# Patient Record
Sex: Male | Born: 1940 | Race: White | Hispanic: No | Smoking: Former smoker
Health system: Southern US, Community
[De-identification: ages and names within clinical notes are randomized; demographics above are authoritative.]

## PROBLEM LIST (undated history)

## (undated) DIAGNOSIS — M545 Low back pain, unspecified: Secondary | ICD-10-CM

## (undated) DIAGNOSIS — Z9119 Patient's noncompliance with other medical treatment and regimen: Secondary | ICD-10-CM

## (undated) DIAGNOSIS — E119 Type 2 diabetes mellitus without complications: Secondary | ICD-10-CM

## (undated) DIAGNOSIS — I4819 Other persistent atrial fibrillation: Secondary | ICD-10-CM

## (undated) DIAGNOSIS — E039 Hypothyroidism, unspecified: Secondary | ICD-10-CM

## (undated) DIAGNOSIS — K219 Gastro-esophageal reflux disease without esophagitis: Secondary | ICD-10-CM

## (undated) DIAGNOSIS — Z91199 Patient's noncompliance with other medical treatment and regimen due to unspecified reason: Secondary | ICD-10-CM

## (undated) DIAGNOSIS — I2510 Atherosclerotic heart disease of native coronary artery without angina pectoris: Secondary | ICD-10-CM

## (undated) DIAGNOSIS — G8929 Other chronic pain: Secondary | ICD-10-CM

## (undated) DIAGNOSIS — E049 Nontoxic goiter, unspecified: Secondary | ICD-10-CM

## (undated) DIAGNOSIS — I119 Hypertensive heart disease without heart failure: Secondary | ICD-10-CM

## (undated) DIAGNOSIS — I639 Cerebral infarction, unspecified: Secondary | ICD-10-CM

## (undated) DIAGNOSIS — M069 Rheumatoid arthritis, unspecified: Secondary | ICD-10-CM

## (undated) DIAGNOSIS — I495 Sick sinus syndrome: Secondary | ICD-10-CM

## (undated) DIAGNOSIS — E7800 Pure hypercholesterolemia, unspecified: Secondary | ICD-10-CM

## (undated) HISTORY — PX: LUMBAR SPINE HARDWARE REMOVAL: SHX1987

## (undated) HISTORY — DX: Rheumatoid arthritis, unspecified: M06.9

## (undated) HISTORY — DX: Sick sinus syndrome: I49.5

## (undated) HISTORY — PX: CORONARY ANGIOPLASTY WITH STENT PLACEMENT: SHX49

## (undated) HISTORY — DX: Other persistent atrial fibrillation: I48.19

## (undated) HISTORY — DX: Atherosclerotic heart disease of native coronary artery without angina pectoris: I25.10

## (undated) HISTORY — DX: Patient's noncompliance with other medical treatment and regimen due to unspecified reason: Z91.199

## (undated) HISTORY — PX: PACEMAKER INSERTION: SHX728

## (undated) HISTORY — DX: Hypertensive heart disease without heart failure: I11.9

## (undated) HISTORY — DX: Patient's noncompliance with other medical treatment and regimen: Z91.19

## (undated) HISTORY — PX: ROTATOR CUFF REPAIR: SHX139

## (undated) HISTORY — PX: POSTERIOR LUMBAR FUSION: SHX6036

## (undated) HISTORY — PX: BACK SURGERY: SHX140

---

## 1998-05-25 ENCOUNTER — Encounter: Admission: RE | Admit: 1998-05-25 | Discharge: 1998-08-23 | Payer: Self-pay

## 1998-07-30 ENCOUNTER — Ambulatory Visit (HOSPITAL_BASED_OUTPATIENT_CLINIC_OR_DEPARTMENT_OTHER): Admission: RE | Admit: 1998-07-30 | Discharge: 1998-07-30 | Payer: Self-pay | Admitting: Orthopaedic Surgery

## 1998-08-14 ENCOUNTER — Encounter: Admission: RE | Admit: 1998-08-14 | Discharge: 1998-11-12 | Payer: Self-pay | Admitting: Orthopaedic Surgery

## 1998-11-13 ENCOUNTER — Encounter: Admission: RE | Admit: 1998-11-13 | Discharge: 1998-12-10 | Payer: Self-pay | Admitting: Orthopaedic Surgery

## 2001-11-02 ENCOUNTER — Inpatient Hospital Stay (HOSPITAL_COMMUNITY): Admission: EM | Admit: 2001-11-02 | Discharge: 2001-11-08 | Payer: Self-pay | Admitting: Cardiology

## 2001-11-03 ENCOUNTER — Encounter: Payer: Self-pay | Admitting: *Deleted

## 2001-11-03 ENCOUNTER — Encounter: Payer: Self-pay | Admitting: Cardiology

## 2001-11-05 ENCOUNTER — Encounter: Payer: Self-pay | Admitting: Cardiovascular Disease

## 2001-11-08 ENCOUNTER — Encounter: Payer: Self-pay | Admitting: Internal Medicine

## 2002-06-04 ENCOUNTER — Ambulatory Visit (HOSPITAL_COMMUNITY): Admission: RE | Admit: 2002-06-04 | Discharge: 2002-06-05 | Payer: Self-pay | Admitting: *Deleted

## 2004-03-19 ENCOUNTER — Ambulatory Visit: Payer: Self-pay | Admitting: Cardiology

## 2004-04-02 ENCOUNTER — Ambulatory Visit: Payer: Self-pay | Admitting: Cardiology

## 2004-04-16 ENCOUNTER — Ambulatory Visit: Payer: Self-pay | Admitting: Cardiology

## 2004-05-07 ENCOUNTER — Ambulatory Visit: Payer: Self-pay | Admitting: Cardiology

## 2004-05-14 ENCOUNTER — Ambulatory Visit: Payer: Self-pay | Admitting: Cardiology

## 2004-05-28 ENCOUNTER — Ambulatory Visit: Payer: Self-pay | Admitting: Cardiology

## 2004-06-15 ENCOUNTER — Ambulatory Visit: Payer: Self-pay

## 2004-06-28 ENCOUNTER — Ambulatory Visit: Payer: Self-pay | Admitting: Cardiology

## 2004-07-28 ENCOUNTER — Ambulatory Visit: Payer: Self-pay | Admitting: Cardiology

## 2004-08-25 ENCOUNTER — Ambulatory Visit: Payer: Self-pay | Admitting: Cardiology

## 2004-09-08 ENCOUNTER — Ambulatory Visit: Payer: Self-pay | Admitting: Internal Medicine

## 2004-09-09 ENCOUNTER — Ambulatory Visit: Payer: Self-pay | Admitting: Cardiology

## 2004-10-21 ENCOUNTER — Ambulatory Visit: Payer: Self-pay | Admitting: Cardiology

## 2004-11-19 ENCOUNTER — Ambulatory Visit: Payer: Self-pay | Admitting: Internal Medicine

## 2004-12-09 ENCOUNTER — Ambulatory Visit: Payer: Self-pay | Admitting: Internal Medicine

## 2004-12-17 ENCOUNTER — Ambulatory Visit: Payer: Self-pay | Admitting: Internal Medicine

## 2005-01-13 ENCOUNTER — Ambulatory Visit: Payer: Self-pay | Admitting: Cardiology

## 2005-03-10 ENCOUNTER — Ambulatory Visit: Payer: Self-pay | Admitting: Cardiology

## 2005-04-12 ENCOUNTER — Ambulatory Visit: Payer: Self-pay | Admitting: Internal Medicine

## 2005-04-14 ENCOUNTER — Ambulatory Visit: Payer: Self-pay | Admitting: Cardiology

## 2005-04-21 ENCOUNTER — Ambulatory Visit: Payer: Self-pay | Admitting: Cardiology

## 2005-04-27 ENCOUNTER — Ambulatory Visit: Payer: Self-pay | Admitting: Cardiology

## 2005-05-13 ENCOUNTER — Ambulatory Visit: Payer: Self-pay | Admitting: Internal Medicine

## 2005-05-26 ENCOUNTER — Ambulatory Visit: Payer: Self-pay | Admitting: Cardiology

## 2005-06-06 ENCOUNTER — Ambulatory Visit: Payer: Self-pay | Admitting: Cardiology

## 2005-06-14 ENCOUNTER — Ambulatory Visit: Payer: Self-pay | Admitting: Cardiology

## 2005-06-14 ENCOUNTER — Encounter: Payer: Self-pay | Admitting: Cardiology

## 2005-06-16 ENCOUNTER — Ambulatory Visit: Payer: Self-pay | Admitting: Internal Medicine

## 2005-06-24 ENCOUNTER — Ambulatory Visit: Payer: Self-pay | Admitting: Cardiology

## 2005-07-15 ENCOUNTER — Ambulatory Visit: Payer: Self-pay | Admitting: Internal Medicine

## 2005-07-15 ENCOUNTER — Ambulatory Visit: Payer: Self-pay | Admitting: Cardiology

## 2005-08-15 ENCOUNTER — Encounter: Admission: RE | Admit: 2005-08-15 | Discharge: 2005-08-15 | Payer: Self-pay | Admitting: Orthopedic Surgery

## 2005-08-15 ENCOUNTER — Ambulatory Visit: Payer: Self-pay | Admitting: Cardiology

## 2005-08-17 ENCOUNTER — Ambulatory Visit: Payer: Self-pay | Admitting: Internal Medicine

## 2005-08-24 ENCOUNTER — Ambulatory Visit: Payer: Self-pay | Admitting: Cardiology

## 2005-08-29 ENCOUNTER — Ambulatory Visit (HOSPITAL_COMMUNITY): Admission: RE | Admit: 2005-08-29 | Discharge: 2005-08-30 | Payer: Self-pay | Admitting: Orthopedic Surgery

## 2005-09-29 ENCOUNTER — Ambulatory Visit: Payer: Self-pay | Admitting: Internal Medicine

## 2005-11-07 ENCOUNTER — Ambulatory Visit: Payer: Self-pay | Admitting: Internal Medicine

## 2005-12-05 ENCOUNTER — Ambulatory Visit: Payer: Self-pay | Admitting: Internal Medicine

## 2005-12-22 ENCOUNTER — Ambulatory Visit: Payer: Self-pay | Admitting: Cardiology

## 2005-12-29 ENCOUNTER — Ambulatory Visit: Payer: Self-pay | Admitting: Cardiology

## 2006-01-05 ENCOUNTER — Ambulatory Visit: Payer: Self-pay | Admitting: Cardiology

## 2006-01-09 ENCOUNTER — Ambulatory Visit: Payer: Self-pay | Admitting: Internal Medicine

## 2006-01-19 ENCOUNTER — Ambulatory Visit: Payer: Self-pay | Admitting: Cardiology

## 2006-01-27 ENCOUNTER — Ambulatory Visit: Payer: Self-pay | Admitting: Cardiology

## 2006-02-06 ENCOUNTER — Ambulatory Visit: Payer: Self-pay | Admitting: Internal Medicine

## 2006-02-24 ENCOUNTER — Ambulatory Visit: Payer: Self-pay | Admitting: Cardiology

## 2006-03-01 ENCOUNTER — Ambulatory Visit: Payer: Self-pay | Admitting: Cardiology

## 2006-03-02 ENCOUNTER — Ambulatory Visit: Payer: Self-pay | Admitting: Cardiology

## 2006-03-22 ENCOUNTER — Ambulatory Visit: Payer: Self-pay | Admitting: Cardiology

## 2006-03-31 ENCOUNTER — Ambulatory Visit: Payer: Self-pay | Admitting: Internal Medicine

## 2006-04-05 ENCOUNTER — Ambulatory Visit: Payer: Self-pay | Admitting: Cardiology

## 2006-04-14 ENCOUNTER — Ambulatory Visit: Payer: Self-pay | Admitting: Internal Medicine

## 2006-04-26 ENCOUNTER — Ambulatory Visit: Payer: Self-pay | Admitting: Cardiology

## 2006-05-08 ENCOUNTER — Ambulatory Visit: Payer: Self-pay | Admitting: Internal Medicine

## 2006-05-10 ENCOUNTER — Ambulatory Visit: Payer: Self-pay | Admitting: Cardiology

## 2006-05-24 ENCOUNTER — Ambulatory Visit: Payer: Self-pay | Admitting: Cardiology

## 2006-06-08 ENCOUNTER — Ambulatory Visit: Payer: Self-pay | Admitting: Internal Medicine

## 2006-06-08 LAB — CONVERTED CEMR LAB
CO2: 29 meq/L (ref 19–32)
Calcium: 9.5 mg/dL (ref 8.4–10.5)
Chloride: 105 meq/L (ref 96–112)
GFR calc Af Amer: 125 mL/min
Glucose, Bld: 179 mg/dL — ABNORMAL HIGH (ref 70–99)
HCT: 45.4 % (ref 39.0–52.0)
Platelets: 225 10*3/uL (ref 150–400)
Potassium: 3.8 meq/L (ref 3.5–5.1)
Prothrombin Time: 17.8 s — ABNORMAL HIGH (ref 10.0–14.0)
RDW: 13.5 % (ref 11.5–14.6)
Sodium: 140 meq/L (ref 135–145)
TSH: 1.34 microintl units/mL (ref 0.35–5.50)
WBC: 6.2 10*3/uL (ref 4.5–10.5)
aPTT: 28.6 s (ref 26.5–36.5)

## 2006-06-14 ENCOUNTER — Ambulatory Visit: Payer: Self-pay | Admitting: Cardiology

## 2006-06-19 ENCOUNTER — Inpatient Hospital Stay (HOSPITAL_BASED_OUTPATIENT_CLINIC_OR_DEPARTMENT_OTHER): Admission: RE | Admit: 2006-06-19 | Discharge: 2006-06-19 | Payer: Self-pay | Admitting: Cardiovascular Disease

## 2006-06-19 ENCOUNTER — Ambulatory Visit: Payer: Self-pay | Admitting: Cardiology

## 2006-06-22 ENCOUNTER — Ambulatory Visit (HOSPITAL_COMMUNITY): Admission: RE | Admit: 2006-06-22 | Discharge: 2006-06-23 | Payer: Self-pay | Admitting: Cardiology

## 2006-06-22 ENCOUNTER — Ambulatory Visit: Payer: Self-pay | Admitting: Cardiology

## 2006-06-22 ENCOUNTER — Encounter: Payer: Self-pay | Admitting: Cardiology

## 2006-06-27 ENCOUNTER — Ambulatory Visit: Payer: Self-pay | Admitting: Cardiology

## 2006-07-04 ENCOUNTER — Ambulatory Visit: Payer: Self-pay | Admitting: Cardiology

## 2006-07-12 ENCOUNTER — Ambulatory Visit: Payer: Self-pay | Admitting: Cardiology

## 2006-07-14 ENCOUNTER — Ambulatory Visit: Payer: Self-pay | Admitting: Internal Medicine

## 2006-07-24 ENCOUNTER — Ambulatory Visit: Payer: Self-pay | Admitting: Cardiology

## 2006-08-04 ENCOUNTER — Ambulatory Visit: Payer: Self-pay | Admitting: Internal Medicine

## 2006-08-17 ENCOUNTER — Encounter: Payer: Self-pay | Admitting: Cardiology

## 2006-08-21 ENCOUNTER — Ambulatory Visit: Payer: Self-pay | Admitting: Cardiology

## 2006-09-01 ENCOUNTER — Ambulatory Visit: Payer: Self-pay | Admitting: Internal Medicine

## 2006-09-19 ENCOUNTER — Ambulatory Visit: Payer: Self-pay | Admitting: Cardiology

## 2006-09-29 ENCOUNTER — Ambulatory Visit: Payer: Self-pay | Admitting: Internal Medicine

## 2006-10-27 ENCOUNTER — Ambulatory Visit: Payer: Self-pay | Admitting: Internal Medicine

## 2006-11-24 ENCOUNTER — Ambulatory Visit: Payer: Self-pay | Admitting: Internal Medicine

## 2006-12-22 ENCOUNTER — Ambulatory Visit: Payer: Self-pay | Admitting: Internal Medicine

## 2007-01-19 ENCOUNTER — Ambulatory Visit: Payer: Self-pay | Admitting: Internal Medicine

## 2007-02-13 ENCOUNTER — Ambulatory Visit: Payer: Self-pay | Admitting: Cardiology

## 2007-02-16 ENCOUNTER — Ambulatory Visit: Payer: Self-pay | Admitting: Internal Medicine

## 2007-03-16 ENCOUNTER — Ambulatory Visit: Payer: Self-pay | Admitting: Internal Medicine

## 2007-04-13 ENCOUNTER — Ambulatory Visit: Payer: Self-pay | Admitting: Internal Medicine

## 2007-05-09 ENCOUNTER — Ambulatory Visit: Payer: Self-pay | Admitting: Internal Medicine

## 2007-05-29 ENCOUNTER — Ambulatory Visit: Payer: Self-pay | Admitting: Internal Medicine

## 2007-08-28 ENCOUNTER — Ambulatory Visit: Payer: Self-pay | Admitting: Cardiology

## 2007-09-03 ENCOUNTER — Encounter: Payer: Self-pay | Admitting: Cardiology

## 2007-09-03 ENCOUNTER — Ambulatory Visit: Payer: Self-pay | Admitting: Cardiology

## 2007-09-07 ENCOUNTER — Ambulatory Visit: Payer: Self-pay | Admitting: Internal Medicine

## 2007-09-18 ENCOUNTER — Ambulatory Visit: Payer: Self-pay | Admitting: Cardiology

## 2007-12-07 ENCOUNTER — Ambulatory Visit: Payer: Self-pay | Admitting: Internal Medicine

## 2008-03-07 ENCOUNTER — Ambulatory Visit: Payer: Self-pay | Admitting: Internal Medicine

## 2008-03-28 ENCOUNTER — Ambulatory Visit: Payer: Self-pay | Admitting: Cardiology

## 2008-06-06 ENCOUNTER — Ambulatory Visit: Payer: Self-pay | Admitting: Internal Medicine

## 2008-06-09 ENCOUNTER — Encounter: Payer: Self-pay | Admitting: Internal Medicine

## 2008-06-10 ENCOUNTER — Ambulatory Visit: Payer: Self-pay | Admitting: Internal Medicine

## 2008-06-20 DIAGNOSIS — I10 Essential (primary) hypertension: Secondary | ICD-10-CM

## 2008-06-20 DIAGNOSIS — E119 Type 2 diabetes mellitus without complications: Secondary | ICD-10-CM

## 2008-06-20 DIAGNOSIS — I2510 Atherosclerotic heart disease of native coronary artery without angina pectoris: Secondary | ICD-10-CM | POA: Insufficient documentation

## 2008-06-20 DIAGNOSIS — Z950 Presence of cardiac pacemaker: Secondary | ICD-10-CM | POA: Insufficient documentation

## 2008-06-20 DIAGNOSIS — I4891 Unspecified atrial fibrillation: Secondary | ICD-10-CM | POA: Insufficient documentation

## 2008-09-09 ENCOUNTER — Ambulatory Visit: Payer: Self-pay | Admitting: Internal Medicine

## 2008-11-12 ENCOUNTER — Ambulatory Visit: Payer: Self-pay | Admitting: Cardiology

## 2008-12-09 ENCOUNTER — Ambulatory Visit: Payer: Self-pay | Admitting: Internal Medicine

## 2009-03-10 ENCOUNTER — Ambulatory Visit: Payer: Self-pay | Admitting: Internal Medicine

## 2009-06-09 ENCOUNTER — Ambulatory Visit: Payer: Self-pay | Admitting: Internal Medicine

## 2009-06-16 ENCOUNTER — Ambulatory Visit: Payer: Self-pay | Admitting: Cardiology

## 2009-06-16 DIAGNOSIS — R9431 Abnormal electrocardiogram [ECG] [EKG]: Secondary | ICD-10-CM

## 2009-08-30 ENCOUNTER — Encounter: Payer: Self-pay | Admitting: Cardiology

## 2009-09-01 ENCOUNTER — Encounter (INDEPENDENT_AMBULATORY_CARE_PROVIDER_SITE_OTHER): Payer: Self-pay | Admitting: *Deleted

## 2009-09-08 ENCOUNTER — Ambulatory Visit: Payer: Self-pay | Admitting: Internal Medicine

## 2009-09-08 ENCOUNTER — Encounter: Payer: Self-pay | Admitting: Cardiology

## 2009-09-14 ENCOUNTER — Encounter (INDEPENDENT_AMBULATORY_CARE_PROVIDER_SITE_OTHER): Payer: Self-pay | Admitting: *Deleted

## 2009-12-04 ENCOUNTER — Encounter: Payer: Self-pay | Admitting: Cardiology

## 2009-12-08 ENCOUNTER — Ambulatory Visit: Payer: Self-pay | Admitting: Internal Medicine

## 2009-12-14 ENCOUNTER — Encounter (INDEPENDENT_AMBULATORY_CARE_PROVIDER_SITE_OTHER): Payer: Self-pay | Admitting: *Deleted

## 2010-02-24 ENCOUNTER — Ambulatory Visit: Payer: Self-pay | Admitting: Cardiology

## 2010-02-24 DIAGNOSIS — R5381 Other malaise: Secondary | ICD-10-CM

## 2010-02-24 DIAGNOSIS — R5383 Other fatigue: Secondary | ICD-10-CM

## 2010-03-09 ENCOUNTER — Ambulatory Visit: Payer: Self-pay | Admitting: Internal Medicine

## 2010-03-11 ENCOUNTER — Ambulatory Visit: Payer: Self-pay | Admitting: Internal Medicine

## 2010-03-15 ENCOUNTER — Encounter: Payer: Self-pay | Admitting: Internal Medicine

## 2010-06-08 ENCOUNTER — Encounter: Payer: Self-pay | Admitting: Internal Medicine

## 2010-06-15 ENCOUNTER — Ambulatory Visit: Payer: Self-pay | Admitting: Internal Medicine

## 2010-06-15 NOTE — Assessment & Plan Note (Signed)
Summary: 6 month fu recv reminder vs   Visit Type:  Follow-up Primary Provider:  Dr.sasser   History of Present Illness: the patient is a 70 year old male with a history of coronary artery disease and paroxysmal atrial fibrillation. The patient reports no recurrent palpitations. He remains in normal sinus rhythm amiodarone. He denies any substernal chest pain both at rest and exertion. He does report a sensation of sharp pain around the pacemaker site 3 weeks ago. However he denies any fever or chills. There is no recurrent tenderness at the pacemaker pocket site. The patient denies any orthopnea PND. He is concerned about the battery life of his pacemaker.the patient had blood work done by Dr. Neita Carp today but he's not certain if a thyroid panel was included.  Clinical Review Panels:  Lab Work TSH 1.34 (06/08/2006) BUN 16 (06/08/2006) Creatinine 0.8 (06/08/2006)  Cardiac Imaging Cardiac Cath Findings previous stent sites are  patent (LAD and RCA) Focal 80% mid LAD lesion stented with non- drug eluting stent  Sam Beverley Fiedler Stuckey (06/22/2006)    Current Medications (verified): 1)  Amiodarone Hcl 200 Mg Tabs (Amiodarone Hcl) .... Take 1 Tablet By Mouth Once A Day 2)  Nitroglycerin 0.4 Mg Subl (Nitroglycerin) .... Place 1 Tablet Under Tongue As Directed 3)  Hydrochlorothiazide 25 Mg Tabs (Hydrochlorothiazide) .... Take One Tablet By Mouth Every Morning. 4)  Aspir-Low 81 Mg Tbec (Aspirin) .... Take 1 Tab Daily 5)  Metformin Hcl 500 Mg Tabs (Metformin Hcl) .... Take 2 Tabs Two Times A Day 6)  Glipizide 10 Mg Tabs (Glipizide) .... Take 1 Tab Two Times A Day 7)  Prilosec 20 Mg Cpdr (Omeprazole) .... Take 1 Tab Daily 8)  Lisinopril 5 Mg Tabs (Lisinopril) .... Take 1 Tab Daily 9)  Lovastatin 40 Mg Tabs (Lovastatin) .... Take 1 1/2 Tabs Daily 10)  Coumadin 3 Mg Tabs (Warfarin Sodium) .... Take As Directed 11)  Folic Acid 20 Mg Caps (Folic Acid) .... Take 1 Tab Daily 12)  Trexall  10 Mg Tabs (Methotrexate Sodium) .... Take 1 Tab On Mondays 13)  Lantus 100 Unit/ml Soln (Insulin Glargine) .... 20 Units Daily  Allergies (verified): No Known Drug Allergies  Past History:  Past Medical History: Last updated: 06/20/2008 atrial fibrillation Status post pacemaker Coronary artery disease-LAD/RCA stenting  Diabetes Hypertension Rheumatoid arthritis  Family History: Last updated: 06/20/2008 Father: Mother: Siblings:  Social History: Last updated: 06/20/2008 Married  Tobacco Use - Yes.  Alcohol Use - no  Risk Factors: Smoking Status: current (06/20/2008)  Review of Systems  The patient denies fatigue, malaise, fever, weight gain/loss, vision loss, decreased hearing, hoarseness, chest pain, palpitations, shortness of breath, prolonged cough, wheezing, sleep apnea, coughing up blood, abdominal pain, blood in stool, nausea, vomiting, diarrhea, heartburn, incontinence, blood in urine, muscle weakness, joint pain, leg swelling, rash, skin lesions, headache, fainting, dizziness, depression, anxiety, enlarged lymph nodes, easy bruising or bleeding, and environmental allergies.    Vital Signs:  Patient profile:   70 year old male Height:      72 inches Weight:      220 pounds BMI:     29.95 Pulse rate:   81 / minute BP sitting:   117 / 70  (right arm)  Vitals Entered By: Dreama Saa, CNA (June 16, 2009 11:18 AM)  Physical Exam  Additional Exam:  General: Well-developed, well-nourished in no distress head: Normocephalic and atraumatic eyes PERRLA/EOMI intact, conjunctiva and lids normal nose: No deformity or lesions mouth normal dentition, normal posterior  pharynx neck: Supple, no JVD.  No masses, thyromegaly or abnormal cervical nodes lungs: Normal breath sounds bilaterally without wheezing.  Normal percussion heart: regular rate and rhythm with normal S1 and S2, no S3 or S4.  PMI is normal.  No pathological murmurs abdomen: Normal bowel sounds,  abdomen is soft and nontender without masses, organomegaly or hernias noted.  No hepatosplenomegaly musculoskeletal: Back normal, normal gait muscle strength and tone normal pulsus: Pulse is normal in all 4 extremities Extremities: No peripheral pitting edema neurologic: Alert and oriented x 3 skin: Intact without lesions or rashes cervical nodes: No significant adenopathy psychologic: Normal affect    EKG  Procedure date:  06/16/2009  Findings:      EKG without magnet. Normal sinus rhythm. Possible old anteroseptal infarct pattern. QTC 546 ms. Likely related to T wave memory. EKG with magnet atrial pacing with nonspecific ST-T wave changes. Heart rate 85 beats per minute.  PPM Specifications Following MD:  Sherryl Manges, MD     PPM Vendor:  Medtronic     PPM Model Number:  [id number]     PPM Serial Number:  [id number] PPM DOI:  11/07/2001     PPM Implanting MD:  Sherryl Manges, MD  Lead 1    Location: RA     DOI: 11/07/2001     Model #: [id number]     Serial #: [id number]     Status: active Lead 2    Location: RV     DOI: 11/07/2001     Model #: [id number]     Serial #: [id number]     Status: active  Magnet Response Rate:  BOL 100 ERI 65  Indications:  TACHY-BRADY SYNDROME   Impression & Recommendations:  Problem # 1:  ATRIAL FIBRILLATION (ICD-427.31) the patient has no recurrence of atrialfibrillation. The patient is in normal sinus rhythm. He will continue on amiodarone 200 mg a day.a chest x-ray PA and lateral will be obtained to monitor for underlying lung disease. His updated medication list for this problem includes:    Amiodarone Hcl 200 Mg Tabs (Amiodarone hcl) .Marland Kitchen... Take 1 tablet by mouth once a day    Aspir-low 81 Mg Tbec (Aspirin) .Marland Kitchen... Take 1 tab daily    Coumadin 3 Mg Tabs (Warfarin sodium) .Marland Kitchen... Take as directed  Orders: T-TSH (41324-40102) T-Chest x-ray, 2 views (71020)  Problem # 2:  CARDIAC PACEMAKER IN SITU (ICD-V45.01) we obtained an EKG with and without magnet. With the  magnet there was normal atrial pacing with a magnet rate of 85 beats per minute.the patient will be set up with an appointment Dr. Johney Frame in 3 months. There is no evidence of battery failure however Orders: T-Chest x-ray, 2 views (71020)  Problem # 3:  CAD (ICD-414.00) the patient has a history of coronary artery disease but clinically appears to have no ischemia. He is due for an exercise Cardiolite study next year. His last catheterization was in February 2008. His updated medication list for this problem includes:    Nitroglycerin 0.4 Mg Subl (Nitroglycerin) .Marland Kitchen... Place 1 tablet under tongue as directed    Aspir-low 81 Mg Tbec (Aspirin) .Marland Kitchen... Take 1 tab daily    Lisinopril 5 Mg Tabs (Lisinopril) .Marland Kitchen... Take 1 tab daily    Coumadin 3 Mg Tabs (Warfarin sodium) .Marland Kitchen... Take as directed  Orders: T-TSH ([id number]) T-Chest x-ray, 2 views (71020)  Problem # 4:  COUMADIN THERAPY (ICD-V58.61) Assessment: Comment Only  Problem # 5:  ABNORMAL ELECTROCARDIOGRAM (ICD-794.31) the patient has QTC  prolongation. This is likely to amiodarone drug therapy. Also the possibility of T wave memory abnormalities cannot be excluded. The patient is not on any other medications that appear to cross QTC prolongation. His updated medication list for this problem includes:    Amiodarone Hcl 200 Mg Tabs (Amiodarone hcl) .Marland Kitchen... Take 1 tablet by mouth once a day    Nitroglycerin 0.4 Mg Subl (Nitroglycerin) .Marland Kitchen... Place 1 tablet under tongue as directed    Aspir-low 81 Mg Tbec (Aspirin) .Marland Kitchen... Take 1 tab daily    Lisinopril 5 Mg Tabs (Lisinopril) .Marland Kitchen... Take 1 tab daily    Coumadin 3 Mg Tabs (Warfarin sodium) .Marland Kitchen... Take as directed  Patient Instructions: 1)  Chest x-ray and labs after 2/16. 2)  Dr. Johney Frame in 3 months. 3)  Follow up in  6 months.

## 2010-06-15 NOTE — Assessment & Plan Note (Signed)
Summary: 2 yr last check 2009   Visit Type:  Pacemaker check Primary Provider:  Dr.sasser   History of Present Illness: The patient presents today for routine electrophysiology followup. He reports doing very well since last being seen in our clinic.  He reports occasional episodes of symptomatic afib (1-2 times per month) with symptoms of lethargy and fatigue.  He denies symptoms related to amiodarone.  Labs were ordered by Dr Andee Lineman 02/24/10 however the patient states that he never got around to having them drawn.   The patient denies symptoms of palpitations, chest pain, shortness of breath, orthopnea, PND, lower extremity edema, dizziness, presyncope, syncope, or neurologic sequela. The patient is tolerating medications without difficulties and is otherwise without complaint today.   Preventive Screening-Counseling & Management  Alcohol-Tobacco     Smoking Status: quit     Year Quit: 1997  Current Medications (verified): 1)  Amiodarone Hcl 200 Mg Tabs (Amiodarone Hcl) .... Take 1 Tablet By Mouth Once A Day 2)  Nitroglycerin 0.4 Mg Subl (Nitroglycerin) .... Place 1 Tablet Under Tongue As Directed 3)  Hydrochlorothiazide 25 Mg Tabs (Hydrochlorothiazide) .... Take One Tablet By Mouth Every Morning. 4)  Aspir-Low 81 Mg Tbec (Aspirin) .... Take 1 Tab Daily 5)  Metformin Hcl 500 Mg Tabs (Metformin Hcl) .... Take 2 Tabs Two Times A Day 6)  Glipizide 10 Mg Tabs (Glipizide) .... Take 1 Tab Two Times A Day 7)  Lisinopril 5 Mg Tabs (Lisinopril) .... Take 1 Tab Daily 8)  Lovastatin 40 Mg Tabs (Lovastatin) .... Take 1 1/2 Tabs Daily 9)  Coumadin 3 Mg Tabs (Warfarin Sodium) .... Take As Directed 10)  Folic Acid 20 Mg Caps (Folic Acid) .... Take 1 Tab Daily 11)  Trexall 10 Mg Tabs (Methotrexate Sodium) .... Take 1 Tab On Mondays 12)  Lantus 100 Unit/ml Soln (Insulin Glargine) .... 20 Units Daily  Allergies (verified): No Known Drug Allergies  Comments:  Nurse/Medical Assistant: The patient's  medication list and allergies were reviewed with the patient and were updated in the Medication and Allergy Lists.  Past History:  Past Medical History: Reviewed history from 06/20/2008 and no changes required. atrial fibrillation Status post pacemaker Coronary artery disease-LAD/RCA stenting  Diabetes Hypertension Rheumatoid arthritis  Social History: Reviewed history from 06/20/2008 and no changes required. Married  Tobacco Use - Yes.  Alcohol Use - no  Review of Systems       All systems are reviewed and negative except as listed in the HPI.   Vital Signs:  Patient profile:   70 year old male Height:      72 inches Weight:      222 pounds Pulse rate:   60 / minute BP sitting:   143 / 71  (left arm) Cuff size:   large  Vitals Entered By: Carlye Grippe (March 11, 2010 3:05 PM)  Physical Exam  General:  Well developed, well nourished, in no acute distress. Head:  normocephalic and atraumatic Eyes:  PERRLA/EOM intact; conjunctiva and lids normal. Mouth:  Teeth, gums and palate normal. Oral mucosa normal. Neck:  supple Chest Wall:  pacemaker pocket is well healed Lungs:  Clear bilaterally to auscultation and percussion. Heart:  RRR, no m/r/g Abdomen:  Bowel sounds positive; abdomen soft and non-tender without masses, organomegaly, or hernias noted. No hepatosplenomegaly. Msk:  Back normal, normal gait. Muscle strength and tone normal. Pulses:  pulses normal in all 4 extremities Extremities:  No clubbing or cyanosis. Neurologic:  Alert and oriented x 3.  PPM Specifications Following MD:  Sherryl Manges, MD     PPM Vendor:  [employer]     PPM Model Number:  [id number]     PPM Serial Number:  [id number] PPM DOI:  11/07/2001     PPM Implanting MD:  Sherryl Manges, MD  Lead 1    Location: RA     DOI: 11/07/2001     Model #: [id number]     Serial #: [id number]     Status: active Lead 2    Location: RV     DOI: 11/07/2001     Model #: [id number]     Serial #: [id number]     Status:  active  Magnet Response Rate:  BOL 100 ERI 65  Indications:  TACHY-BRADY SYNDROME   PPM Follow Up Battery Voltage:  2.78 V     Battery Est. Longevity:  6 yrs       PPM Device Measurements [facility]  Amplitude: 2.80 mV, Impedance: 517 ohms, Threshold: 0.50 V at 0.40 msec Right Ventricle  Amplitude: 11.20 mV, Impedance: 677 ohms, Threshold: 1.00 V at 0.40 msec  Episodes MS Episodes:  1318     Percent Mode Switch:  1.5%     Ventricular High Rate:  0     Atrial Pacing:  26.2%     Ventricular Pacing:  0.9%  Parameters Mode:  DDD     Lower Rate Limit:  60     Upper Rate Limit:  120 Paced AV Delay:  250     Sensed AV Delay:  220 Next Cardiology Appt Due:  09/14/2010 Tech Comments:  1318 AHR EPISODES--LONGEST 57 MINUTES 14 SECONDS. + COUMADIN. NORMAL DEVICE FUNCTION.  NO CHANGES MADE. ROV IN 6 MTHS W/DEVICE CLINIC. Vella Kohler  March 11, 2010 3:43 PM MD Comments:  agree  Impression & Recommendations:  Problem # 1:  ATRIAL FIBRILLATION (ICD-427.31) occasional episodes of afib continue coumadin we will check LFTs and TFTs as well as CXR and CBC today as the patient takes both amiodarone and methotrexate.  Problem # 2:  HYPERTENSION, BENIGN (ICD-401.1) stable  Problem # 3:  CARDIAC PACEMAKER IN SITU (ICD-V45.01) normal pacemaker function for bradycardia no changes today  Other Orders: T-CBC No Diff (01027-25366) T-Hepatic Function (44034-74259) T-T4, Free (56387-56433) T-TSH (29518-84166) T-Chest x-ray, 2 views (06301)

## 2010-06-15 NOTE — Cardiovascular Report (Signed)
Summary: TTM   TTM   Imported By: Roderic Ovens 12/30/2009 10:51:28  _____________________________________________________________________  External Attachment:    Type:   Image     Comment:   External Document

## 2010-06-15 NOTE — Cardiovascular Report (Signed)
Summary: TTM   TTM   Imported By: Roderic Ovens 03/25/2010 12:03:30  _____________________________________________________________________  External Attachment:    Type:   Image     Comment:   External Document

## 2010-06-15 NOTE — Cardiovascular Report (Signed)
Summary: TTM   TTM   Imported By: Roderic Ovens 09/23/2009 16:10:17  _____________________________________________________________________  External Attachment:    Type:   Image     Comment:   External Document

## 2010-06-15 NOTE — Letter (Signed)
Summary: Generic Engineer, agricultural at [facility] [address]   [place], [place] [address]   Phone: [phone]  Fax: [phone]        September 01, 2009 MRN: [id number]    Clifford [name] [address] [place], [place]  [address]    Dear Mr. KASSA,   You had lab work and a chest x-ray ordered following your February 1st office visit. It does not appear these have been done yet.  Please take the enclosed order to the [facility] AS SOON AS POSSIBLE to have labs and x-ray done. If you will not be able to do these tests or have had them done with your primary MD, please notify our office so that we can properly document this in your chart.     Sincerely,  Cyril Loosen, RN, BSN  This letter has been electronically signed by your physician.

## 2010-06-15 NOTE — Cardiovascular Report (Signed)
Summary: TTM   TTM   Imported By: Roderic Ovens 07/06/2009 15:52:20  _____________________________________________________________________  External Attachment:    Type:   Image     Comment:   External Document

## 2010-06-15 NOTE — Assessment & Plan Note (Signed)
Summary: 6 mo fu per aug reminder-srs   Visit Type:  Follow-up Primary Provider:  Dr.sasser   History of Present Illness: the patient is a 70 year old malehistory of coronary disease and paroxysmal atrial fibrillation. The patient is currently normal sinus rhythm by EKG today. He remains on amiodarone and Coumadin therapy. He status post dual chamber pacemaker for tachybradycardia syndrome.  The patient reports a sensation of weakness and fatigue he has had some sinusitis. He reports shortness of breath but feels he is deconditioned. He has no chest tightness or wheezing. He has some left arm pain secondary to left carpal tunnel syndrome. He reports frequent urination. However from a cardiovascular standpoint he appears to be stable. His last catheterization was in 2008 and was found to have patent stent sites to the LAD and RCA. He also had an 80% mid LAD lesion stented with a non-drug-eluting stents.  Preventive Screening-Counseling & Management  Alcohol-Tobacco     Smoking Status: quit     Year Quit: 1997  Current Medications (verified): 1)  Amiodarone Hcl 200 Mg Tabs (Amiodarone Hcl) .... Take 1 Tablet By Mouth Once A Day 2)  Nitroglycerin 0.4 Mg Subl (Nitroglycerin) .... Place 1 Tablet Under Tongue As Directed 3)  Hydrochlorothiazide 25 Mg Tabs (Hydrochlorothiazide) .... Take One Tablet By Mouth Every Morning. 4)  Aspir-Low 81 Mg Tbec (Aspirin) .... Take 1 Tab Daily 5)  Metformin Hcl 500 Mg Tabs (Metformin Hcl) .... Take 2 Tabs Two Times A Day 6)  Glipizide 10 Mg Tabs (Glipizide) .... Take 1 Tab Two Times A Day 7)  Prilosec 20 Mg Cpdr (Omeprazole) .... Take 1 Tab Daily 8)  Lisinopril 5 Mg Tabs (Lisinopril) .... Take 1 Tab Daily 9)  Lovastatin 40 Mg Tabs (Lovastatin) .... Take 1 1/2 Tabs Daily 10)  Coumadin 3 Mg Tabs (Warfarin Sodium) .... Take As Directed 11)  Folic Acid 20 Mg Caps (Folic Acid) .... Take 1 Tab Daily 12)  Trexall 10 Mg Tabs (Methotrexate Sodium) .... Take 1 Tab On  Mondays 13)  Lantus 100 Unit/ml Soln (Insulin Glargine) .... 20 Units Daily  Allergies (verified): No Known Drug Allergies  Comments:  Nurse/Medical Assistant: The patient is currently on medications but does not know the name or dosage at this time. Instructed to contact our office with details. Will update medication list at that time.  Past History:  Past Medical History: Last updated: 06/20/2008 atrial fibrillation Status post pacemaker Coronary artery disease-LAD/RCA stenting  Diabetes Hypertension Rheumatoid arthritis  Family History: Last updated: 06/20/2008 Father: Mother: Siblings:  Social History: Last updated: 06/20/2008 Married  Tobacco Use - Yes.  Alcohol Use - no  Risk Factors: Smoking Status: quit (02/24/2010)  Social History: Smoking Status:  quit  Review of Systems       The patient complains of fatigue.  The patient denies malaise, fever, weight gain/loss, vision loss, decreased hearing, hoarseness, chest pain, palpitations, prolonged cough, wheezing, sleep apnea, coughing up blood, abdominal pain, blood in stool, nausea, vomiting, diarrhea, heartburn, incontinence, blood in urine, muscle weakness, joint pain, leg swelling, rash, skin lesions, headache, fainting, dizziness, depression, anxiety, enlarged lymph nodes, easy bruising or bleeding, and environmental allergies.    Vital Signs:  Patient profile:   70 year old male Height:      72 inches Weight:      221 pounds Pulse rate:   74 / minute BP sitting:   136 / 68  (left arm) Cuff size:   large  Vitals Entered By: Carlye Grippe (  February 24, 2010 9:21 AM)  Physical Exam  Additional Exam:  General: Well-developed, well-nourished in no distress head: Normocephalic and atraumatic eyes PERRLA/EOMI intact, conjunctiva and lids normal nose: No deformity or lesions mouth normal dentition, normal posterior pharynx neck: Supple, no JVD.  No masses, thyromegaly or abnormal cervical  nodes lungs: Normal breath sounds bilaterally without wheezing.  Normal percussion heart: regular rate and rhythm with normal S1 and S2, no S3 or S4.  PMI is normal.  No pathological murmurs abdomen: Normal bowel sounds, abdomen is soft and nontender without masses, organomegaly or hernias noted.  No hepatosplenomegaly musculoskeletal: Back normal, normal gait muscle strength and tone normal pulsus: Pulse is normal in all 4 extremities Extremities: No peripheral pitting edema neurologic: Alert and oriented x 3 skin: Intact without lesions or rashes cervical nodes: No significant adenopathy psychologic: Normal affect    PPM Specifications Following MD:  Sherryl Manges, MD     PPM Vendor:  [employer]     PPM Model Number:  [id number]     PPM Serial Number:  [id number] PPM DOI:  11/07/2001     PPM Implanting MD:  Sherryl Manges, MD  Lead 1    Location: RA     DOI: 11/07/2001     Model #: 6440     Serial #: [id number]     Status: active Lead 2    Location: RV     DOI: 11/07/2001     Model #: [id number]     Serial #: [id number]     Status: active  Magnet Response Rate:  BOL 100 ERI 65  Indications:  TACHY-BRADY SYNDROME   Impression & Recommendations:  Problem # 1:  ATRIAL FIBRILLATION (ICD-427.31) patient remains in normal sinus rhythm. He's continuing amiodarone. We will check a TSH and liver function tests. His updated medication list for this problem includes:    Amiodarone Hcl 200 Mg Tabs (Amiodarone hcl) .Marland Kitchen... Take 1 tablet by mouth once a day    Aspir-low 81 Mg Tbec (Aspirin) .Marland Kitchen... Take 1 tab daily    Coumadin 3 Mg Tabs (Warfarin sodium) .Marland Kitchen... Take as directed  Orders: EKG w/ Interpretation (93000) T-CBC No Diff (84166-06301) T-TSH (60109-32355)  Problem # 2:  COUMADIN THERAPY (ICD-V58.61) the patient is compliant with Coumadin therapy. INR July was slightly low at 1.9. His levels are followed by his primary care physician  Problem # 3:  CAD (ICD-414.00) the patient has history of  multiple prior stent placement to the LAD and RCA. He denies however any chest pain. No further indication at present time for stress testing. His updated medication list for this problem includes:    Nitroglycerin 0.4 Mg Subl (Nitroglycerin) .Marland Kitchen... Place 1 tablet under tongue as directed    Aspir-low 81 Mg Tbec (Aspirin) .Marland Kitchen... Take 1 tab daily    Lisinopril 5 Mg Tabs (Lisinopril) .Marland Kitchen... Take 1 tab daily    Coumadin 3 Mg Tabs (Warfarin sodium) .Marland Kitchen... Take as directed  Problem # 4:  CARDIAC PACEMAKER IN SITU (ICD-V45.01) Assessment: Comment Only  Patient Instructions: 1)  Labs:  TSH, CBC  2)  Dr. Johney Frame 3)  Follow up in  6 months

## 2010-06-15 NOTE — Letter (Signed)
Summary: Device-Delinquent Check  [facility], Main Office  [address]   [place], [place] [address]   Phone: [phone]  Fax: [phone]     December 14, 2009 MRN: [id number]   [facility] [address] [place], [place]  [address]   Dear Mr. STICKLER,  According to our records, you have not had your implanted device checked in the recommended period of time.  We are unable to determine appropriate device function without checking your device on a regular basis.  Please call our office to schedule an appointment as soon as possible.  If you are having your device checked by another physician, please call us so that we may update our records.  Thank you, Architectural technologist Device Clinic ** [facility] [phone] **

## 2010-06-15 NOTE — Letter (Signed)
Summary: Engineer, materials at [facility]  [address]   [place], [place] [address]   Phone: [phone]  Fax: [phone]        Sep 14, 2009 MRN: [id number]   [facility] [address] [place], [place]  [address]   Dear Mr. BAQUERO,  Your test ordered by Selena Batten has been reviewed by your physician (or physician assistant) and was found to be normal or stable. Your physician (or physician assistant) felt no changes were needed at this time.  ____ Echocardiogram  ____ Cardiac Stress Test  __X__ Lab Work   ____ Peripheral vascular study of arms, legs or neck  __X__ CT scan or X-ray (chest)  ____ Lung or Breathing test  ____ Other:   Thank you.   Hoover Brunette, LPN    Duane Boston, M.D., F.A.C.C. Thressa Sheller, M.D., F.A.C.C. Oneal Grout, M.D., F.A.C.C. Cheree Ditto, M.D., F.A.C.C. Daiva Nakayama, M.D., F.A.C.C. Kenney Houseman, M.D., F.A.C.C. Jeanne Ivan, PA-C

## 2010-06-18 NOTE — Cardiovascular Report (Signed)
Summary: [facility]/ INTERROGATION REPORT  [facility]/ INTERROGATION REPORT   Imported By: Dorise Hiss 03/12/2010 11:39:36  _____________________________________________________________________  External Attachment:    Type:   Image     Comment:   External Document

## 2010-07-07 NOTE — Cardiovascular Report (Signed)
Summary: TTM   TTM   Imported By: Roderic Ovens 07/02/2010 12:01:55  _____________________________________________________________________  External Attachment:    Type:   Image     Comment:   External Document

## 2010-09-07 ENCOUNTER — Encounter: Payer: Self-pay | Admitting: Internal Medicine

## 2010-09-07 DIAGNOSIS — I495 Sick sinus syndrome: Secondary | ICD-10-CM

## 2010-09-27 ENCOUNTER — Encounter: Payer: Self-pay | Admitting: Cardiology

## 2010-09-27 ENCOUNTER — Encounter: Payer: Self-pay | Admitting: *Deleted

## 2010-09-28 NOTE — Assessment & Plan Note (Signed)
[facility]                          [facility]   NAME:[name] [name] INNES                        MRN:          [id number]  DATE:02/13/2007                            DOB:          01-22-1941    PRIMARY CARDIOLOGIST:  Dr. Lewayne [name].   PRIMARY ELECTROPHYSIOLOGIST:  Dr. Sherryl [name].   REASON FOR VISIT:  Scheduled 36-month followup.   Since last seen here in the clinic in February of this year, by Dr.  Andee [name], the patient continues to report no interim development of  exertional angina pectoris.  He has since started Coumadin  anticoagulation for history of paroxysmal atrial fibrillation, which is  closely monitored by Dr. Fara [name].   The patient was to have started lisinopril 20 mg daily, per Dr. Andee [name],  for more aggressive control of hypertension.  Although the patient did  not present with a list of his medications, he seems to think that this  is what he is taking at home.   The patient was last seen in the pacer clinic by Dr. Graciela [name] in January of  this year, at which time he had normal functioning parameters of his  Medtronic dual-chamber device.  Of note, however, there were multiple  mode switch episodes.   Electrocardiogram today reveals NSR at 67 BPM with normal axis and  nonspecific ST abnormalities.   CURRENT MEDICATIONS:  1. Coumadin 3 mg as directed.  2. Aspirin 81 daily.  3. Amiodarone 200 daily.  4. Glucophage 10 mg daily.  5. Methotrexate 10 mg every Monday.  6. Lovastatin 20 mg daily.  7. Metformin 1000 mg q.a.m./500 mg q.p.m.   PHYSICAL EXAMINATION:  Blood pressure 127/79, pulse 65, regular, weight  225.  GENERAL:  A 70 year old male, moderately obese, sitting upright in no  distress.  HEENT:  Normocephalic, atraumatic.  NECK:  Palpable bilateral carotid pulses without bruits.  LUNGS:  Clear to auscultation in all fields.  HEART:  Regular rate and rhythm (S1-S2).  No significant murmurs.  No  rubs.  ABDOMEN:  Protuberant, nontender.  EXTREMITIES:  Bilateral lower extremity edema 1+ (left greater than  right).  NEUROLOGIC:  No focal deficits.   IMPRESSION:  1. Multivessel coronary artery disease.      a.     Status post bare-metal stenting, mid left anterior       descending artery, February 2008.      b.     Status post bare-metal stenting, proximal left anterior       descending and mid right coronary artery, January 2004.  2. Paroxysmal atrial fibrillation/sick sinus syndrome.      a.     Status post Medtronic dual-chamber pacemaker implantation in       2003, by Dr. Lewayne [name].      b.     Maintaining normal sinus rhythm on amiodarone.  3. Chronic Coumadin.  4. Dyslipidemia.  5. Type 2 diabetes mellitus.  6. Hypertension.  7. Obstructive sleep apnea.  8. Obesity.  9. Chronic obstructive pulmonary disease/history of tobacco.  10.Rheumatoid arthritis.   PLAN:  1. Surveillance  blood work, including TSH level, for amiodarone      toxicity.  We will also check a 2-view chest x-ray.  2. Aggressive lipid management, per Dr. Neita [name], with target LDL goal      of 70 or less.  3. Resume regularly scheduled followup with Dr. Sherryl [name] in our      pacemaker clinic here in [place].  4. Schedule return clinic followup with myself and Dr. Andee [name] in 6      months.      Clifford Serpe, PA-C  Electronically Signed      Clifford Codding, MD,FACC  Electronically Signed   GS/MedQ  DD: 02/13/2007  DT: 02/14/2007  Job #: [id number]   cc:   [name] [name]

## 2010-09-28 NOTE — Assessment & Plan Note (Signed)
[facility]                          [facility]   NAME:Clifford [name], Clifford [name]                        MRN:          [id number]  DATE:11/12/2008                            DOB:          [birth year]    REFERRING PHYSICIAN:  Fara Chute   HISTORY OF PRESENT ILLNESS:  The patient is a pleasant 70 year old male  with a history of coronary artery disease and paroxysmal atrial  fibrillation.  The patient is status post Medtronic dual-chamber  pacemaker implantation.  He is maintaining normal sinus rhythm with  amiodarone.  He is doing well.  He denies any chest pain, orthopnea, or  PND.  He does have some shortness of breath on exertion, although the  pattern has not changed significantly.  He has noticed some lower  extremity edema, however.  His blood pressure is poorly controlled and  is 157/74.   MEDICATIONS:  1. Folic acid daily.  2. Methotrexate 10 mg on Mondays.  3. Amiodarone 200 mg p.o. daily.  4. Coumadin 3 mg as directed.  5. Aspirin 81 mg p.o. daily.  6. Lisinopril 20 mg p.o. daily.  7. Lovastatin 40 mg 1-1/2 tablet p.o. daily.  8. Glipizide 10 mg 1 tablet p.o. b.i.d.  9. Metformin 500 mg 2 tablets p.o. b.i.d.   PHYSICAL EXAMINATION:  VITAL SIGNS:  Blood pressure 157/74, heart rate  62, weighs 225 pounds.  GENERAL:  Well-nourished, white male, in no apparent distress.  HEENT:  Pupils, eyes clear; conjunctivae clear.  NECK:  Supple.  No carotid upstroke.  No carotid bruits.  LUNGS:  Clear breath sounds bilaterally.  HEART:  Regular rate and rhythm.  Normal S1 and S2.  No murmurs, rubs,  or gallops.  ABDOMEN:  Soft, nontender.  No rebound or guarding.  Good bowel sounds.  EXTREMITIES:  No cyanosis, clubbing, or edema.  NEURO:  The patient is alert, oriented, and grossly nonfocal.   PROBLEM LIST:  1. Multivessel coronary artery disease.      a.     Status post PMS mid LAD, February 2008.      b.     Status post PMS proximal LAD  and mid RCA, January 2004.  2. Paroxysmal atrial fibrillation/sick sinus syndrome.      a.     Status post Medtronic dual-chamber pacemaker in 2003 by Dr.       Lewayne Bunting.      b.     Maintained normal sinus rhythm on amiodarone.  3. Chronic Coumadin followed by Dr. Neita Carp.  4. Dyslipidemia.  5. Type 2 diabetes mellitus.  6. Hypertension.  7. Obstructive sleep apnea.  8. Chronic obstructive pulmonary disease.  9. Rheumatoid arthritis.  10.Lower extremity edema.   PLAN:  1. The patient has had some evidence of volume overload lower      extremity edema.  He also has shortness of breath, although his      pattern is stable.  I did add hydrochlorothiazide for his lower      extremity edema at the dose of 25 mg p.o. daily.  Also, because of  better blood pressure control.  2. The patient will have amiodarone toxicity monitoring and testing      done with liver function test, TSH, and a chest x-ray next week      when he comes back for his laboratory work.  3. Otherwise, well.  From a cardiovascular standpoint, the patient is      stable and he can follow up with [place] in 6 months.     Learta Codding, MD,FACC  Electronically Signed    GED/MedQ  DD: 11/12/2008  DT: 11/13/2008  Job #: [id number]   cc:   Fara Chute

## 2010-09-28 NOTE — Assessment & Plan Note (Signed)
[facility]                         ELECTROPHYSIOLOGY OFFICE NOTE   NAME:Clifford [name], Clifford [name]                        MRN:          [id number]  DATE:05/29/2007                            DOB:          09-03-1940    HISTORY OF PRESENT ILLNESS:  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  I have  saw him a year ago at which time he was having problems with indigestion  which was his anginal equivalent.  He underwent catheterization which  demonstrated high-grade LAD disease and subsequent stenting with a bare  metal stent.  He was treated transiently with Plavix.  He is now on  aspirin and Coumadin.  He has had no recurrent exertional indigestion.   MEDICATIONS:  Amiodarone, lovastatin, glipizide, metformin, lisinopril  and methotrexate.   PHYSICAL EXAMINATION:  VITAL SIGNS:  Blood pressure 139/76, pulse was  70.  LUNGS:  Clear.  HEART:  Sounds were regular.  EXTREMITIES:  Without edema.   Interrogation of his Medtronic Sigma pulse generator demonstrates a P-  wave of 2.8 with impedance of 432, threshold 0.5 and 0.4.  The R-wave  was 11 with impedance of 622, a threshold of 0.4, battery voltage of  2.79.   IMPRESSION:  1. Tachybrady syndrome.  2. Status post pacer for the above.  3. Amiodarone for the above.  4. Coronary artery disease status post bare metal stenting of his LAD      without recurrent high grade obstruction.  5. Rheumatoid arthritis.  6. Methotrexate.  7. Coumadin therapy being managed by Dr. Neita Carp because of cost and      copays.   PLAN:  Clifford [name] is doing fine.  I would like to have him reestablish  with Cardiology.   We will also need to make sure his amiodarone surveillance laboratories  are being drawn.   We will give him a card for that today.     Duke Salvia, MD, [facility]  Electronically Signed    SCK/MedQ  DD: 05/29/2007  DT: 05/29/2007  Job #:  [id number]   cc:   Hillery Aldo, Jonita Albee

## 2010-09-28 NOTE — Assessment & Plan Note (Signed)
[facility]                          [facility]   NAME:Carbonneau, JOEPH SZATKOWSKI                        MRN:          [id number]  DATE:08/28/2007                            DOB:          [birth year]    REFERRING PHYSICIAN:  Fara Chute   HISTORY OF PRESENT ILLNESS:  The patient is a 70 year old male with a  history of coronary artery status post bare metal stenting of the LAD.  The patient also has rheumatoid arthritis and is on amiodarone drug  therapy for maintenance of normal sinus rhythm status post pacemaker  implantation.  The patient denies shortness of breath.  He does report  chest pain and a feeling of indigestion in the morning when he wakes up.  He takes antacids and it appears to improve his symptoms particularly in  the early morning hours.  He denies any palpitations or syncope.   MEDICATIONS:  1. Folic acid.  2. Methotrexate.  3. Amiodarone 200 mg p.o. daily.  4. Coumadin as directed.  5. Aspirin 81 mg a day.  6. Glucophage.  7. Lisinopril.  8. Metformin.  9. Lovastatin.   PHYSICAL EXAMINATION:  VITAL SIGNS:  Blood pressure 135/69, heart rate  67, weight 229 pounds.  NECK:  Normal carotid upstroke, no carotid bruits.  LUNGS:  Clear breath sounds bilaterally.  HEART:  Regular rate and rhythm.  Normal S1, S2, no murmurs, rubs or  gallops.  ABDOMEN:  Soft, nontender, no rebound or guarding.  Good bowel sounds.  EXTREMITIES:  No cyanosis, clubbing or edema.  NEUROLOGICAL:  Patient alert, oriented, and grossly nonfocal.   PROBLEM LIST:  1. Coronary artery disease.  2. Recurrent indigestion.  3. Status post bare metal stent to the LAD.  4. Amiodarone drug therapy for tachy-brady syndrome.  5. Status post pacemaker implantation from Dr. Graciela Husbands.  6. Rheumatoid arthritis.  7. Coumadin anticoagulation.   PLAN:  1. The patient does have recurrent sensation of indigestion which has      been in the past angina equivalent.  We  will proceed with a      Cardiolite imaging study.  2. The patient will need to have testing done for amiodarone drug      toxicity including LFT's and TSH.  3. Next office visit a chest x-ray also will need to be done.  4. The patient can follow up with [place] for review of stress study.     Learta Codding, MD,FACC  Electronically Signed    GED/MedQ  DD: 08/28/2007  DT: 08/28/2007  Job #: [id number]   cc:   Fara Chute

## 2010-09-28 NOTE — Assessment & Plan Note (Signed)
[facility]                          [facility] NOTE   NAME:Burdo, SELDON BARRELL                        MRN:          [id number]  DATE:09/18/2007                            DOB:          [birth year]    PRIMARY CARDIOLOGIST:  Dr. Lewayne Bunting.   REASON FOR VISIT:  Scheduled follow-up.   Mr. Naves returns to our clinic for review of recent adenosine stress  Cardiolite test results.  This was ordered by Dr. Andee Lineman when the  patient was just seen here on April 14, for further evaluation of  symptoms worrisome for ischemia.  The patient informs me today, however,  that the symptoms were simply due to indigestion, and that he is not  experiencing any exertion-related chest discomfort.   The stress perfusion images revealed a small degree of mild, inferior  reversibility; EF 59%.   The patient previously underwent PCI with bare metal stenting of a high-  grade mid LAD lesion in February 2008. He had otherwise a continued  patency of both the proximal LAD and mid RCA stent site.  Left  ventricular function was preserved.  There was a 70% large OM stenosis,  as well.   CURRENT MEDICATIONS:  1. Amiodarone 200 daily.  2. Coumadin 3 mg as directed.  3. Aspirin 81 daily.  4. Methotrexate 10 mg q. Monday.  5. Glucophage 10 mg daily.  6. Lisinopril 20 daily.  7. Metformin 500 mg, as directed.  8. Lovastatin 40 daily.   PHYSICAL EXAM:  Blood pressure 128/81, pulse 60 regular, weight 225.  GENERAL:  A 70 year old male sitting upright in no distress.  HEENT:  Atraumatic, normocephalic.  NECK:  Palpable carotid pulse without bruits; no JVD.  LUNGS:  Clear to auscultation in all fields.  HEART:  Regular rate and rhythm (S1, S2), no significant murmurs.  No  rubs.  ABDOMEN:  Soft, nontender, intact bowel sounds.  EXTREMITIES:  Palpable distal pulses.  No pedal edema.  NEUROLOGIC:  No focal deficits.   IMPRESSION:  1. Multivessel coronary artery  disease.      a.     Status post BMS mid LAD, February 2008.      b.     Status post BMS proximal LAD and mid RCA, January 2004.  2. Paroxysmal atrial fibrillation/sick sinus syndrome.      a.     Status post Medtronic dual chamber pacemaker in 2003, by Dr.       Lewayne Bunting.      b.     Maintaining NSR on amiodarone.  3. Chronic Coumadin, followed by Dr. Neita Carp.  4. Dyslipidemia.  5. Type 2 diabetes mellitus.  6. Hypertension.  7. Obstructive sleep apnea.  8. Chronic obstructive pulmonary disease/history of tobacco.  9. Rheumatoid arthritis.   PLAN:  1. No further cardiac workup indicated at this point in time.      Continue medical therapy and close monitoring is recommended.  2. Follow-up with Dr. Sherryl Manges here in the [facility] pacer clinic, as      previously scheduled.  3. Follow-up with myself and  Dr. Andee Lineman in 6 months.  4. Aggressive lipid management, per Dr. Neita Carp, with target LDL goal      of 70, or less.      Gene Serpe, PA-C  Electronically Signed      Learta Codding, MD,FACC  Electronically Signed   GS/MedQ  DD: 09/18/2007  DT: 09/18/2007  Job #: [id number]   cc:   Fara Chute

## 2010-09-29 ENCOUNTER — Ambulatory Visit (INDEPENDENT_AMBULATORY_CARE_PROVIDER_SITE_OTHER): Payer: PRIVATE HEALTH INSURANCE | Admitting: Cardiology

## 2010-09-29 ENCOUNTER — Encounter: Payer: Self-pay | Admitting: Cardiology

## 2010-09-29 DIAGNOSIS — I2510 Atherosclerotic heart disease of native coronary artery without angina pectoris: Secondary | ICD-10-CM

## 2010-09-29 DIAGNOSIS — I10 Essential (primary) hypertension: Secondary | ICD-10-CM

## 2010-09-29 DIAGNOSIS — Z79899 Other long term (current) drug therapy: Secondary | ICD-10-CM

## 2010-09-29 DIAGNOSIS — I4891 Unspecified atrial fibrillation: Secondary | ICD-10-CM

## 2010-09-29 DIAGNOSIS — Z950 Presence of cardiac pacemaker: Secondary | ICD-10-CM

## 2010-09-29 NOTE — Assessment & Plan Note (Signed)
Blood pressure controlled. Continue present medications. Check potassium and renal function. 

## 2010-09-29 NOTE — Patient Instructions (Signed)
Your physician wants you to follow-up in: 6 months. You will receive a reminder letter in the mail one-two months in advance. If you don't receive a letter, please call our office to schedule the follow-up appointment. Your physician recommends that you continue on your current medications as directed. Please refer to the Current Medication list given to you today. Your physician recommends that you go to the [facility] for lab work and a chest x-ray. (CBC/BMET/LFT/TSH) Do not eat or drink after midnight.

## 2010-09-29 NOTE — Assessment & Plan Note (Signed)
Management per electrophysiology. 

## 2010-09-29 NOTE — Progress Notes (Signed)
HPI: Pleasant male for fu of coronary disease and paroxysmal atrial fibrillation. He status post dual chamber pacemaker for tachybradycardia syndrome. His last catheterization was in 2008 and was found to have patent stent sites to the LAD and RCA. He also had an 80% mid LAD lesion stented with a non-drug-eluting stents. He was last seen by Dr. Andee Lineman in Oct of 2011. Since then, the patient has dyspnea with more extreme activities but not with routine activities. It is relieved with rest. It is not associated with chest pain. There is no orthopnea, PND or pedal edema. There is no syncope or palpitations. There is no exertional chest pain.   Current Outpatient Prescriptions  Medication Sig Dispense Refill  . amiodarone (PACERONE) 200 MG tablet Take 200 mg by mouth daily.        Marland Kitchen aspirin 81 MG tablet Take 81 mg by mouth daily.        . Folic Acid 20 MG CAPS Take 1 capsule by mouth daily.        Marland Kitchen glipiZIDE (GLUCOTROL) 10 MG tablet Take 10 mg by mouth 2 (two) times daily before a meal.        . hydrochlorothiazide 25 MG tablet Take 25 mg by mouth daily.        . insulin glargine (LANTUS) 100 UNIT/ML injection Inject 20 Units into the skin at bedtime.        Marland Kitchen lisinopril (PRINIVIL,ZESTRIL) 5 MG tablet Take 5 mg by mouth daily.        Marland Kitchen lovastatin (MEVACOR) 40 MG tablet Take 60 mg by mouth at bedtime.        . metFORMIN (GLUCOPHAGE) 500 MG tablet Take 1,000 mg by mouth 2 (two) times daily with a meal.        . methotrexate (RHEUMATREX) 10 MG tablet Take 10 mg by mouth once a week. Caution: Chemotherapy. Protect from light.       . nitroGLYCERIN (NITROSTAT) 0.4 MG SL tablet Place 0.4 mg under the tongue every 5 (five) minutes as needed.        . warfarin (COUMADIN) 3 MG tablet Take 3 mg by mouth daily.           Past Medical History  Diagnosis Date  . Atrial fibrillation     Patient on amiodarone  . Unspecified essential hypertension   . Type II or unspecified type diabetes mellitus without  mention of complication, not stated as uncontrolled   . Rheumatoid arthritis   . CAD (coronary artery disease)     Past Surgical History  Procedure Date  . Pacemaker insertion     History   Social History  . Marital Status: Married    Spouse Name: N/A    Number of Children: N/A  . Years of Education: N/A   Occupational History  . RETIRED    Social History Main Topics  . Smoking status: Former Smoker -- 2.0 packs/day for 50 years    Types: Cigarettes    Quit date: 05/17/1995  . Smokeless tobacco: Not on file   Comment:   Year Quit: 1997  . Alcohol Use: No  . Drug Use: No  . Sexually Active: Not on file   Other Topics Concern  . Not on file   Social History Narrative  . No narrative on file    ROS: no fevers or chills, productive cough, hemoptysis, dysphasia, odynophagia, melena, hematochezia, dysuria, hematuria, rash, seizure activity, orthopnea, PND, pedal edema, claudication. Remaining systems are negative.  Physical Exam: Well-developed well-nourished in no acute distress.  Skin is warm and dry.  HEENT is normal.  Neck is supple. No thyromegaly.  Chest is clear to auscultation with normal expansion.  Cardiovascular exam is regular rate and rhythm.  Abdominal exam nontender or distended. No masses palpated. Extremities show no edema. neuro grossly intact  ECG Atrial paced rhythm. Axis normal. Prolonged QT interval.

## 2010-09-29 NOTE — Assessment & Plan Note (Signed)
Patient remains in sinus rhythm. Continue present medications including amiodarone and Coumadin. Check TSH, liver functions and chest x-ray.

## 2010-09-29 NOTE — Assessment & Plan Note (Signed)
Continue aspirin and statin. 

## 2010-10-01 NOTE — Cardiovascular Report (Signed)
NAME:  Clifford [name], Clifford [name]                           ACCOUNT NO.:  [id number]   MEDICAL RECORD NO.:  [id number]                   PATIENT TYPE:  OIB   LOCATION:  6533                                 FACILITY:  [facility]   PHYSICIAN:  Veneda Melter, M.D. [facility]               DATE OF BIRTH:  27-Oct-1940   DATE OF PROCEDURE:  06/04/2002  DATE OF DISCHARGE:                              CARDIAC CATHETERIZATION   PROCEDURE PERFORMED:  1. Left heart catheterization.  2. Left ventriculogram.  3. Selective coronary angiography.  4. Percutaneous transluminal coronary angioplasty and stent placement, mid     right coronary artery.  5. Primary stent placement, proximal left anterior descending artery.   DIAGNOSES:  1. Two-vessel coronary artery disease  2. Normal left ventricular systolic function.   HISTORY:  The patient is a 70 year old gentleman who presents with atypical  left-sided chest discomfort.  The patient underwent a stress imaging study  showing scarring and ischemia in the inferior wall, and he is referred for  further assessment.   TECHNIQUE:  Informed consent was obtained.  The patient was brought to the  catheterization lab.  A 6-French sheath was placed in the right femoral  artery using the modified Seldinger technique.  The 6-French JL4 and JR4  catheters were then used to engage the left and right coronary arteries, and  selective angiography performed in various projections using manual  injections of contrast.  A 6-French pigtail catheter was then advanced into  the left ventricle, and a left ventriculogram performed using power  injections of contrast.  Initial findings are as follows:   FINDINGS:  1. Left main trunk:  Large caliber vessel with mild eccentric plaque of 30%     in the mid section.  2. LAD:  This begins as a large caliber vessel and provides two diagonal     branches as well as a large septal perforator in the mid section.  It     then extends to the apex and  provides a small third diagonal branch     distally.  The LAD has diffuse disease of 30% in the proximal segment.     There is then a high-grade eccentric plaque, at least 70%, preceding the     first septal perforator.  The mid LAD has moderate narrowing of 50%     encompassing a large first diagonal branch.  The distal LAD then has mild     diffuse disease of 30%.  The diagonal branches have mild disease of 30%     as well.  3. Left circumflex artery:  This begins as a large caliber vessel and     provides a medium caliber bifurcating marginal branch in the mid section     and an AV continuation.  The AV circumflex has mild disease.  The     marginal branch has moderate narrowing of 40-50% prior to  the     bifurcation.  4. Right coronary artery:  Dominant.  This is a large caliber vessel that     provides a posterior descending artery and four posterior ventricular     branches in its terminal segment.  The right coronary artery has moderate     diffuse disease of 30-40% in the proximal segment.  There is then     narrowing of 40% in the mid section after a small RV marginal branch.     This is then followed by a long tubular narrowing of 80% which is     slightly hazy.  The distal right coronary artery has mild diffuse disease     of 30%, encompassing the branch vessels.  5. LV:  Normal end systolic and end diastolic dimensions.  Overall left     ventricular function is well preserved.  Ejection fraction of greater     than 55%.  No mitral regurgitation.    PRESSURES:  1. LV pressure is 140/5.  2. Aortic was 140/75.  3. LVEDP equals 10.   These findings were reviewed with the patient.  We elected to proceed with  percutaneous intervention of the right coronary artery and the proximal LAD.  The patient was given heparin and Integrilin on a weight-adjusted basis to  maintain an ACT of approximately 300 seconds.  He was also given 300 mg of  Plavix orally.  A 5-French JR4 guide  catheter was then used to engage the  right coronary artery.  A 0.014-inch Forte wire was advanced in the mid  vessel.  Angiography was then performed to size the vessel and lesion  length.  The wire was then advanced in the distal RCA.  A 3.5 x 16-mm  Express2 stent was introduced, carefully positioned in the mid RCA, and  deployed at 14 atmospheres for 45 seconds.  Repeat angiography showed an  excellent result with full coverage of the lesion and mild residual disease.  A 4.5 x 12-mm Quantum Maverick balloon was then introduced.  Two inflations  were then performed within the stent at 12 atmospheres for 30 seconds.  Repeat angiography was performed showing an excellent result, no residual  stenosis, TIMI-3 flow through the vessel, and no evidence of distal vessel  damage.   Attention was then turned to the LAD, and a 6-French JL4 guide catheter was  used to engage the left coronary artery.  Selective guide shots were then  obtained with the Forte wire, again, to size the lesion and the vessel.  The  Forte wire was advanced in the distal LAD.  A 3.5 x 8-mm Express2 stent was  then introduced, carefully positioned in the proximal LAD extending up to  the septal perforator, and deployed at 14 atmospheres for 30 seconds.  Repeat angiography, after the administration of intracoronary nitroglycerin,  showed an excellent result with no residual stenosis, full coverage of the  lesion, and no compromise of the septal perforator.  Final angiography was  then performed in various projections confirming these findings.  The guide  catheter was then removed, and the sheath secured in position.  The patient  tolerated the procedure well and was transferred to the floor in stable  condition.   FINAL RESULTS:  1. Successful percutaneous transluminal coronary angioplasty and stent     placement to the mid right coronary artery with reduction in 80% hazy    narrowing to 0%, with placement of a 3.5 x  16-mm Express2 stent, dilated  to 4.5 mm.  2. Successful primary stent placement to the proximal left anterior     descending artery with reduction in 70% narrowing to 0%, with placement     of a 3.5 x 8-mm Express2 stent.    ASSESSMENT/PLAN:  The patient is a 70 year old gentleman with two-vessel  coronary artery disease and well-preserved left ventricular function who  underwent percutaneous intervention with stent placement to the left  anterior descending artery and mid right coronary artery.  He will be  continued on Plavix for one month's time.  His Coumadin will be resumed for  his paroxysmal atrial fibrillation.                                               Veneda Melter, M.D. [facility]    NG/MEDQ  D:  06/04/2002  T:  06/04/2002  Job:  [id number]   cc:   Jonelle Sidle, M.D. [facility]  [address]  [place]  [place] [address]  Fax: 1   Marcelino Duster  [address] [place]  [place] [address]  Fax: [phone]

## 2010-10-01 NOTE — Assessment & Plan Note (Signed)
[facility]                         ELECTROPHYSIOLOGY OFFICE NOTE   NAME:Clifford [name], Clifford [name]                        MRN:          [id number]  DATE:06/08/2006                            DOB:          1940-08-06    Clifford [name] is seen at our request because of concerns about his  transtelephonic monitor.  Interrogation of his pacemaker demonstrates  normal function.  However, the patient's wife also notes that he has  been having significant indigestion.  This was his anginal equivalent.  This has been associated with exertion, relieved by rest.  I do not have  all of his past cardiac history, though his family says that he has had  a number of stents placed.   A catheterization done by Dr. Chales Abrahams in January 2004 was notable for  stenting of the LAD and of his RCA.  As he is not on Plavix, I assume  that they were non-DES.   He then underwent pacemaker implantation for persistent atrial  fibrillation and bradycardia by Doylene Canning. Ladona Ridgel, MD   CURRENT MEDICATIONS:  1. Methotrexate.  2. Amiodarone 200 mg.  3. Hydrochlorothiazide.  4. Coumadin.  5. Lovastatin.  6. Glipizide.   Notably he is not on a beta blocker and he is not on an ACE inhibitor.   PHYSICAL EXAMINATION:  VITAL SIGNS:  On examination his blood pressure  is very high today at 172/95.  His pulse is 76.  LUNGS:  Clear.  CARDIAC:  Heart sounds were regular.  EXTREMITIES:  Without edema.  VASCULAR:  Femoral pulses were 2+.  Distal pulses were intact.   Interrogation of his Medtronic Sigma pulse generator demonstrates a P  wave of 2.8 with an impedance of 489, a threshold of 0.5 at 0.4, with an  R wave of 11 with impedance of 662, with a threshold of 1 V at 0.4.  Battery voltage is 2.79.  There are multiple mode switch episodes.   IMPRESSION:  1. Paroxysmal atrial fibrillation.  2. Status post pacemaker for the above.  3. Amiodarone for the above.  4. Ischemic heart disease with prior  stenting of his left anterior      descending artery and his right coronary artery.  5. Diabetes.  6. Coumadin therapy.   We will need to:  1. Hold his Coumadin.  2. Begin ACE inhibitor therapy.  3. Will need to consider beta blocker therapy.  4. Undertake catheterization.  His pacemaker follow-up will be in one      year's time.   I should also note that the patient had significant complaints about the  transtelephonic [employer].  I called their customer service and  asked them to look into this and be in touch with Altha Harm about  this.     Duke Salvia, MD, [facility]  Electronically Signed    SCK/MedQ  DD: 06/08/2006  DT: 06/08/2006  Job #: [id number]

## 2010-10-01 NOTE — Op Note (Signed)
NAMEJAMARRIUS, Clifford [name]                 ACCOUNT NO.:  [id number]   MEDICAL RECORD NO.:  [id number]          PATIENT TYPE:  AMB   LOCATION:  DAY                          FACILITY:  [facility]   PHYSICIAN:  Ollen Gross, M.D.    DATE OF BIRTH:  [birth year]   DATE OF PROCEDURE:  08/29/2005  DATE OF DISCHARGE:                                 OPERATIVE REPORT   PREOPERATIVE DIAGNOSIS:  Left shoulder impingement, AC arthrosis, rotator  cuff tear.   POSTOPERATIVE DIAGNOSIS:  Left shoulder impingement, AC arthrosis, rotator  cuff tear.   PROCEDURE:  Left shoulder open subacromial decompression, distal clavicle  resection, rotator cuff repair.   SURGEON:  Ollen Gross, M.D.   ASSISTANT:  Alexzandrew L. Julien Girt, P.A.   ANESTHESIA:  General with interscalene block.   ESTIMATED BLOOD LOSS:  Minimal.   DRAINS:  None.   COMPLICATIONS:  None.   CONDITION:  Stable to recovery.   CLINICAL NOTE:  [name] is a 70 year old male with significant left  shoulder pain and dysfunction for several months.  Exam and history  consistent with rotator cuff tear confirmed by arthrogram.  Presents now the  above-mentioned procedure.   PROCEDURE IN DETAIL:  The successful administration of interscalene block  and general anesthetic, the patient was placed sitting upright in beach-  chair and his left upper extremity shoulder girdle isolated from his trunk  with plastic drapes and prepped and draped in usual sterile fashion.  Incision lines made along the skin lines from posterior to anterior with a  10 blade going through subcutaneous tissue to the level of the deltoid  fascia.  Subcutaneous flaps were elevated circumferentially.  The distal  clavicle was identified and the longitudinal split made the clavicular  fascia, coursing across the anterior aspect of the acromion and going about  2-3 cm down the between the fibers of deltoid.  The entire anterior soft  tissue flap was elevated subperiosteally.  He  also elevated posteriorly  subperiosteally along the distal clavicle to expose the distal clavicle.  Homan retractors were placed and about a centimeter distal clavicle was  resected.  A Cobb elevator was then passed underneath the acromion and the  oscillating saw was used to remove the subacromial spur to create a flat  undersurface of the acromion.  The bursa is then excised.  He had a massive  rotator cuff tear with a large flap of supra and infraspinatus and this was  able to be advanced to the greater tuberosity.  There was about a 1 cm area  in the back which could not be covered.  A trough was created and two super  Mitek anchors were placed.  We passed the ends of the sutures through the  end of the cuff and then tied the knot and oversewed it.  It was a very  stable repair.  We then thoroughly irrigated and reattached deltoid through  the acromion with Ethibond sutures through drill holes.  The fascia over the  distal clavicle is  imbricated and closed with Ethibond suture.  The deltoid split was closed  with Vicryl.  Subcu closed interrupted 2-0 Vicryl and subcuticular running 4-  0 Monocryl.  Incisions cleaned and dried and Steri-Strips and bulky sterile  dressing applied.  He was placed into a sling, awakened and transferred to  recovery in stable condition.      Ollen Gross, M.D.  Electronically Signed     FA/MEDQ  D:  08/29/2005  T:  08/30/2005  Job:  [id number]

## 2010-10-01 NOTE — Assessment & Plan Note (Signed)
[facility]                           ELECTROPHYSIOLOGY OFFICE NOTE   NAME:Clifford [name], Clifford [name]                        MRN:          [id number]  DATE:03/02/2006                            DOB:          1940-11-16    Mr. 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   INTERROGATION:  Battery voltage 2.79 V.  There were close to 3000 notes  which were stored in the device.  In the atrium, intrinsic amplitude is  greater than 2.8 mV, with an impedance of 510 ohms and threshold of 0.5 V at  0.4 msec.  In the right ventricle intrinsic amplitude is greater than 11.2  mV, with an impedance of 636 ohms and a threshold of 1.0 V at 0.4 msec.   DISPOSITION:  No programming changes were made at this time.  Mr. Gordillo will  begin telephone checks with [employer] in December on a monthly basis.  He will  be seen in the Clinic in one year's time for follow-up.      ______________________________  Cleatrice Burke, RN    ______________________________  Duke Salvia, MD, [facility]    CF/MedQ  DD:  03/02/2006  DT:  03/03/2006  Job #:  [id number]

## 2010-10-01 NOTE — Procedures (Signed)
[place]  Patient:    Clifford [name], Clifford [name] Visit Number: [id number] MRN: [id number]          Service Type: MED Location: 2000 2032 01 Attending Physician:  Junious Silk Dictated by:   Doylene Canning. Ladona Ridgel, M.D. [facility] Proc. Date: 11/07/01 Admit Date:  11/02/2001   CC:         Thomas C. Daleen Squibb, M.D. [facility]  Tyler Pita, M.D., [place], [place]  Kathrine Cords, [facility]   Procedure Report  PROCEDURE:  Implantation of a dual-chamber pacemaker.  INDICATION:  Symptomatic tachybrady syndrome with dizziness and documented pauses of over six seconds.  INTRODUCTION:  The patient is a very pleasant 70 year old man with a history of rheumatoid arthritis and hyperlipidemia who presented to the hospital with dizzy spells and was subsequently found to have long pauses of up to six seconds.  This was associated with atrial fibrillation and post-termination pauses.  He subsequently had a temporary pacemaker placed and is now referred for permanent pacemaker implantation.  DESCRIPTION OF PROCEDURE:  After informed consent was obtained, the patient was taken to the diagnostic EP lab in the fasted state.  After the usual preparation and draping, intravenous fentanyl and midazolam were given for sedation.  A total of 30 cc of lidocaine was infiltrated into the left infraclavicular region.  A 6 cm incision was carried out over this region and electrocautery utilized to dissect down to the subpectoralis fascia.  Ten cubic centimeters of contrast demonstrated a patent left subclavian vein, and it was subsequently punctured x2 and the Medtronic model 5076 58-cm active-fixation lead, serial number [id number], was advanced into the right ventricle and the Medtronic model 5076 52-cm active-fixation lead, serial number [id number], was placed in the right atrium.  The ventricular lead was advanced in the right ventricular apex, and at this  location R-waves measured 17 millivolts and the pacing threshold was 0.5 volts at 0.5 msec with a pacing impedance of 1057 Ohms.  This was present after the lead was actively fixed. Ten-volt pacing did not result in diaphragmatic stimulation.  Next attention was turned to the atrial lead.  It was placed in the right atrial appendage, and P-waves there measured 3 millivolts with a pacing threshold of 0.6volts at 0.5 msec and a pacing impedance of 686 Ohms after the lead was actively fixed. With both atrial and ventricular leads in satisfactory position, they were secured to the subpectoralis fascia with a figure-of-eight silk suture.  In addition, the sewing sleeve was secured with silk suture.  Electrocautery was utilized to make a subcutaneous pocket, and the Medtronic Sigma [id number] dual-chamber pacemaker, serial number [id number], was connected to the atrial and ventricular pacing leads and placed in the subcutaneous pocket.  The pocket was irrigated with kanamycin.  Electrocautery was utilized to maintain hemostasis, and the incision was closed with a layer of 2-0 Vicryl, followed by a layer of 3-0 Vicryl, followed by a layer of 4-0 Vicryl.  Benzoin was painted on the skin and Steri-Strips were obtained and a pressure dressing placed and the patient returned to his room in satisfactory condition.  COMPLICATIONS:  There were no immediate procedural complications.  RESULTS:  This demonstrates successful implantation of a Medtronic dual-chamber pacemaker in a patient with symptomatic tachybrady syndrome and paroxysmal atrial fibrillation. Dictated by:   Doylene Canning. Ladona Ridgel, M.D. [facility] Attending Physician:  Junious Silk DD:  11/07/01 TD:  11/08/01 Job: [id number]

## 2010-10-01 NOTE — Discharge Summary (Signed)
[place]  Patient:    Clifford [name], BROUILLET Visit Number: [id number] MRN: [id number]          Service Type: MED Location: 2000 2032 01 Attending Physician:  Junious Silk Dictated by:   Chinita Pester, N.P. Admit Date:  11/02/2001 Discharge Date: 11/08/2001   CC:         Gerrit Friends. Dietrich Pates, M.D. [facility]  [facility], [place], [place].   Discharge Summary  PRIMARY DIAGNOSIS:  Atrial fibrillation with sinus bradycardia.  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  They seem to be coming closer together. The patient presented to the hospital where he was found to be in atrial fibrillation and was having up to six second pauses after premature ventricular contractions.  The patient is symptomatic lying in bed.  The patient was transferred to [facility] from [facility] for placement of a temporary pacemaker which he had inserted via his right IJ.  HOSPITAL COURSE:  The patient underwent a cardiac consultation with electrophysiology for placement of a permanent pacemaker.  The patient was placed on heparin at that time.  A 2D echocardiogram was performed which showed an ejection fraction of 55 to 65%.  Aortic valve mildly calcified, mild aortic root dilatation, left atrium was mildly dilated and there was appearance of a catheter or pacing wire in the right ventricle.  The patient underwent placement of a Medtronics type dual chamber pacemaker on November 07, 2001.  The patient tolerated the procedure well and had no immediate postoperative complications.  Postoperative values were within normal limits. Postoperative chest x-ray showed a nodule in the right lower lobe and CT scan of the chest was then obtained which showed no pulmonary nodule in the right lower lobe laterally, small  hiatal hernia, calculi in lymph nodes, calculi granulomas in mid right lower lobe consistent with granulomatous disease.  The patient was then discharged to home in stable condition.  The patient was discharged on the following medications.  DISCHARGE MEDICATIONS: 1. Lipitor 10 mg nightly. 2. Folic acid one daily. 3. Prilosec as needed. 4. Celebrex 200 mg daily. 5. Methotrexate 10 mg on Sundays as before. 6. Hydrochlorothiazide 12.5 mg q.d. b.i.d. 7. Amiodarone 200 mg daily. 8. Coumadin 3 mg nightly.  ACTIVITY:  Patient is not to do any heavy lifting or strenuous activity with his left arm for four to six weeks.  He is not to raise his left arm above his head for one week but gradually raise as depicted on the discharge instructions.  DIET:  Low fat, low cholesterol, low salt diet.  WOUND CARE: Patient is not to shower or get his wound wet for one week.  FOLLOW UP:  A protime with INR was scheduled for Monday November 12, 2001 at 9:30 a.m. at the [facility] office in [place].  The patient is to see the physician assistant at the [facility] office in [place] on November 15, 2001 at 3:00 p.m. and he is supplied with the phone number to get instructions for the office.  He has a follow up appointment with Dr. Sharrell Ku in three months in the [facility] office and the office will call to notify him of that appointment. Dictated by:   Chinita Pester, N.P. Attending Physician:  Junious Silk DD:  11/08/01 TD:  11/09/01 Job: [id number]

## 2010-10-01 NOTE — H&P (Signed)
[place]  Patient:    Clifford [name], Clifford [name] Visit Number: [id number] MRN: [id number]          Service Type: MED Location: [facility] [id number] Attending Physician:  Junious Silk Dictated by:   Jesse Sans Wall, M.D. [facility] Admit Date:  11/02/2001   CC:         Tyler Pita, M.D., [facility], [place], [place]   History and Physical  CHIEF COMPLAINT: Dizzy spells, particularly the last week.  HISTORY OF PRESENT ILLNESS: Clifford [name] is a very pleasant, jovial 70 year old married white male, a retired Naval architect, who for the past ten months has been having dizzy spells.  Over the last week it has gotten worse.  These seem to be "coming closer together."  He presented to the hospital, where he was found to be in atrial flutter and was having up to six second pauses after PVCs.  He is symptomatic with these even lying in bed.  Upon arrival to [facility] he was quite stable.  His general atrial flutter rate is in the 70s with a lot of ectopy. He had a transcutaneous pacer in place.  Even up to 50 milliamps, which he was tolerating fine, he would not completely capture and certainly did not perfuse.  A temporary pacer was decided to be in put in.  Several attempts were made by me on the right subclavian per the patients preference and then through a right IJ.  I never could aspirate venous blood.  I have called Dr. Gerri Spore to come in and assist me with this procedure.  PAST MEDICAL HISTORY:  1. History of rheumatoid arthritis.  2. Hyperlipidemia.  3. Right rotator cuff repair.  4. Back surgery twice.  5. He does have a history of frequent indigestion that is relieved with     Rolaids and Prilosec.  He has no cardiac history.  There is no history of diabetes, hypertension, or COPD.  He has never had cerebrovascular disease or peripheral vascular disease.  There is no history of thyroid  disease.  MEDICATIONS:  1. Methotrexate three weekly.  2. Folic acid one q.d.  3. Lipitor 10 mg q.d.  4. Celebrex 200 mg q.d.  5. Tylenol p.r.n.  6. Vicodin p.r.n.  7. Prilosec p.r.n.  SOCIAL HISTORY: He lives in [place], [place].  He has three daughters.  He quit smoking 70 years ago.  He smoked 1-1/2 packs a day for 45 years.  He is a retired Naval architect.  He does not drink alcohol.  FAMILY HISTORY: Noncontributory.  REVIEW OF SYSTEMS: Negative other than in the HPI.  PHYSICAL EXAMINATION:  VITAL SIGNS: His blood pressure today is 151/80, respiratory rate is 20 and unlabored.  His heart rate is varying between 30-110, with frequent pauses. TEMP is 97.5 degrees.  Weight is 243 pounds.  O2 saturation 97% on 2 liters.  GENERAL: He is a well-developed, very muscular with bull neck and large chest male in no acute distress.  SKIN: Warm and dry.  No obvious rashes or lesions.  HEENT: Normocephalic, atraumatic.  PERRLA.  EOMI.  Sclerae clear.  NECK: No JVD.  Carotid upstrokes equal bilaterally without bruits.  No thyromegaly.  Trachea midline.  No lymphadenopathy.  CARDIAC: Irregularly irregular rhythm with a split S2.  There is no murmur.  LUNGS: Clear to auscultation and percussion, with decreased breath sounds throughout.  ABDOMEN: Soft, good bowel sounds.  No obvious hepatosplenomegaly.  EXTREMITIES:  No clubbing, cyanosis, or edema.  Pulses brisk bilaterally.  LABORATORY DATA: Chest x-ray from [facility] demonstrated normal size heart, with no widening of the mediastinum.  There are no pleural effusions or acute pulmonary disease.  Laboratories at the outside hospital showed a hemoglobin of 15.2, platelets 242,000, WBC normal.  Potassium 3.9, creatinine 1.0.  CPK x1 and troponin x1 negative.  ASSESSMENT:  1. Presyncope.     a. Atrial flutter with variable ventricular rate, with up to six second        pauses post premature ventricular complexes.  2.  Hypertension.  3. Hyperglycemia.  4. History of rheumatoid arthritis.  5. Hyperlipidemia.  6. Remote tobacco, chronic obstructive pulmonary disease.  PLAN:  1. Temporary pacer tonight with Dr. Wanita Chamberlain assistance.  2. Permanent pacemaker implantation on Monday.  3. Check TSH, magnesium, and serial enzymes.  4. Get 2D echocardiogram.  5. Stress Cardiolite after pacer to rule out ischemic heart disease.  6. Lipid profile in the morning.  7. Fasting blood sugar. Dictated by:   Jesse Sans Wall, M.D. [facility] Attending Physician:  Junious Silk DD:  11/02/01 TD:  11/05/01 Job: [id number]

## 2010-10-01 NOTE — Discharge Summary (Signed)
Clifford [name], Clifford [name]                 ACCOUNT NO.:  [id number]   MEDICAL RECORD NO.:  [id number]          PATIENT TYPE:  OIB   LOCATION:  6525                         FACILITY:  [facility]   PHYSICIAN:  Nicolasa Ducking, ANP DATE OF BIRTH:  Nov 25, 1940   DATE OF ADMISSION:  06/22/2006  DATE OF DISCHARGE:                               DISCHARGE SUMMARY   PRIMARY CARDIOLOGIST:  Dr. Sherryl Manges.   PATIENT PROFILE:  This is a 70 year old Caucasian male with a prior  history of CAD with status post recent catheterization secondary to  unstable angina who presents for PCI.   PROBLEM LIST:  1. CAD.      a.     Status post PCI of the LAD with 3.58-mm Express 2 bare-metal       stent June 04, 2002.  The RCA was also done at that time with       3.5 x 16-mm Express 2 bare-metal stent      b.     June 19, 2006, cardiac catheterization 80% mid-LAD       stenosis otherwise nonobstructive disease with patent sense of       purity of 60%.  2. Atrial fibrillation with history of post termination pauses.      a.     November 07, 2001, status post Medtronic sigmoid dual-chamber       permanent pacemaker.      b.     On chronic amiodarone and Coumadin therapy.  3. Hyperlipidemia.  4. COPD.  5. Remote tobacco abuse 60-pack-year history, quitting in the mid-      1990s.  6. Type 2 diabetes mellitus.  7. Rheumatoid arthritis.   ALLERGIES:  DEMEROL causes nausea.   HISTORY OF PRESENT ILLNESS:  This is a 70 year old white male recently  seen in clinic by Dr. Graciela Husbands on January 24th, at which time he complained  of his anginal equivalent and set up for left heart cardiac  catheterization.  Catheterization took place February 4th revealing new  LAD disease and otherwise nonobstructive disease and patent stents with  normal LV function.  Decision was made to pursue PCI of the distal LAD  today.  Since his cath he has not had any recurrent chest pain or  shortness of breath.   HOME MEDICATIONS:  1.  Amiodarone 200 mg daily.  2. Folic acid 1 mg daily.  3. Hydrochlorothiazide 25 mg half a tablet daily.  4. Coumadin as prescribed.  5. Lovastatin 40 mg daily.  6. Glipizide 10 mg daily.  7. Aspirin 81 mg daily.  8. Methotrexate 24 mg 4 tablets q. Monday.  9. Metformin 500 mg 2 tabs q.a.m., 1 tab q.p.m.  10.Plavix 75 mg daily.   FAMILY HISTORY:  Mother is alive at age 35 and well.  Father died at age  19 following Alzheimer's and hip fracture.  He has 2 brother that have  died of cancer.  Another brother who is alive and well and 2 sisters who  are alive and well.   SOCIAL HISTORY:  He lives in [place], [place], with his  wife.  He is a  retired Naval architect.  He has a remote history of tobacco use quitting  about 12-13 years ago after smoking about 2 packs a day for 30 years.  He denies any alcohol or drug use and does not routinely exercise.   REVIEW OF SYSTEMS:  Positive for urinary hesitancy and dribbling.  Otherwise all systems reviewed and negative.   PHYSICAL EXAMINATION:  GENERAL:  Pleasant white male in no acute  distress.  Awake, alert, and oriented x3.  NECK:  Normal carotid upstrokes.  No bruits or JVD.  LUNGS:  Respirations regular and unlabored.  Clear to auscultation.  CARDIAC:  Regular S1 and S2.  No S3, S4, or murmurs.  ABDOMEN:  Round, soft, nontender, nondistended.  Bowel sounds present.  EXTREMITIES:  Warm, dry, pink.  No clubbing, cyanosis or edema.  Dorsalis pedis and posterior tibial pulses 2+ and equal bilaterally.   CLINICAL FINDINGS:  His EKG shows sinus rhythm with inferior Q's and  otherwise nonspecific ST changes.   ASSESSMENT AND PLAN:  1. Coronary artery disease.  The patient for percutaneous intervention      today.  Continue aspirin and Plavix and statin therapy.  2. Type 2 diabetes mellitus.  Hold metformin.  Continue glipizide.      Add sliding scale insulin.  3. Hyperlipidemia.  Continue statin therapy.  4. History of atrial fibrillation.   Continue amiodarone.  Coumadin      obviously on hold for catheterization.  Plan to resume post      catheterization per Dr. Riley Kill.  5. Chronic obstructive pulmonary disease.  No currently wheezing.  6. Remote tobacco abuse.  Quit greater than 10 years ago.  7. Rheumatoid arthritis.  He is on methotrexate weekly at home and      offers no complaints.      Nicolasa Ducking, ANP     CB/MEDQ  D:  06/22/2006  T:  06/23/2006  Job:  [id number]

## 2010-10-01 NOTE — Discharge Summary (Signed)
Clifford [name], Clifford [name]                 ACCOUNT NO.:  [id number]   MEDICAL RECORD NO.:  [id number]          PATIENT TYPE:  OIB   LOCATION:  6525                         FACILITY:  [facility]   PHYSICIAN:  Arturo Morton. Riley Kill, MD, FACCDATE OF BIRTH:  March 24, 1941   DATE OF ADMISSION:  06/22/2006  DATE OF DISCHARGE:  06/23/2006                               DISCHARGE SUMMARY   PROCEDURES:  1. Cardiac catheterization.  2. Percutaneous transluminal coronary angiography and bare metal stent      to the mid left anterior descending.   PRIMARY DIAGNOSIS:  Unstable anginal pain.   SECONDARY DIAGNOSIS:  1. History of paroxysmal atrial fibrillation.  2. Poorly controlled diabetes.  3. Status post percutaneous transluminal coronary angiography and      stent to the left anterior descending and right coronary artery.  4. History of shoulder surgery and back surgery x2  5. History of post termination pauses with his atrial fibrillation and      status post Medtronic dual chamber permanent pacemaker.  6. Chronic anticoagulation with Coumadin.  7. Chronic obstructive pulmonary disease.  8. 60-pack-year history of tobacco use, quit greater than ten years.  9. Rheumatoid arthritis.  10.Allergy or intolerance to Demerol.   TIME AT DISCHARGE:  34 minutes.   HOSPITAL COURSE:  Mr. Clifford [name] is a 70 year old male with known coronary  artery disease.  He was evaluated in the office and had an outpatient  cardiac catheterization to assess his anatomy.  This showed an 80% LAD  and he was brought back to the lab for percutaneous intervention and  admitted for this on June 22, 2006.  Mr. Clifford [name] had a non drug-eluting  stent to the mid LAD reducing the stenosis from 80% to zero.  He did  Clifford [name] postprocedure and his postprocedure enzymes were negative.  He had  apical pleural thickening on his chest x-ray but no acute disease.  His  hemoglobin and hematocrit were within normal limits.  Dr. Riley Kill felt  that Mr. Clifford [name]  could be discharged home with outpatient follow up  arranged.   DISCHARGE INSTRUCTIONS:  His activity level is to be increased  gradually.  He is to call our office for any problems with the cath  site.  He is to stick to a low fat diabetic diet.  He is to follow up  with Dr. Andee Lineman on Tuesday, February 12 at 3:00 p.m. and he is to follow  up with Dr. Neita Carp as needed.   DISCHARGE MEDICATIONS:  1. Plavix 75 mg daily for a month.  2. Aspirin 325 mg a day.  3. Coumadin is on hold for five days and then to be resumed.  4. Amiodarone 200 mg daily.  5. Folic acid 1 mg daily.  6. Lovastatin 40 mg daily.  7. Glipizide ER 10 mg daily  8. Metformin 500 mg 2 tablets in the a.m. and 1 tablet in the p.m. is      currently on hold and can be resumed in 24 hours.  9. Methotrexate 2.5 mg, 4 tablets once a week on Monday.  10.Nitroglycerin sublingual  p.r.n.      Theodore Demark, PA-C      Arturo Morton. Riley Kill, MD, [facility]  Electronically Signed    RB/MEDQ  D:  06/23/2006  T:  06/23/2006  Job:  [id number]   cc:   Fara Chute

## 2010-10-01 NOTE — Cardiovascular Report (Signed)
Clifford [name], Clifford [name]                 ACCOUNT NO.:  [id number]   MEDICAL RECORD NO.:  [id number]          PATIENT TYPE:  OIB   LOCATION:  6525                         FACILITY:  [facility]   PHYSICIAN:  Arturo Morton. Riley Kill, MD, FACCDATE OF BIRTH:  13-Mar-1941   DATE OF PROCEDURE:  06/22/2006  DATE OF DISCHARGE:                            CARDIAC CATHETERIZATION   INDICATIONS:  A 70 year old with prior pacer and stents in both the LAD  and right coronary artery.  The patient has had an angina equivalent.  Catheterization done by Dr. Diona Browner demonstrated a high-grade stenosis  in the mid distal portion of the LAD.  Percutaneous intervention was  recommended.  Risks were discussed with the patient.   PROCEDURE:  Percutaneous stenting with a non drug-eluting platform in  the mid left anterior descending artery.   DESCRIPTION OF PROCEDURE:  The patient was brought to the  catheterization laboratory and prepped and draped in usual fashion.  Informed consent had been obtained.  Importantly, the patient has a  history of paroxysmal atrial fibrillation and is on warfarin  anticoagulation.  Therefore, despite his diabetes, a non-drug-eluting  platform was planned; also, fortunately, there was a very short stenosis  length.   The right femoral artery was entered and a 6-French sheath was placed.  Bivalirudin was given according to protocol.  Additional clopidogrel was  given to his baseline aspirin and clopidogrel.  A JL-4 guiding catheter  was utilized.  A Prowater wire was taken down across the lesion after an  appropriate ACT was checked.  The vessel was directly stented using a  2.25 x 12 [employer] Express2 stent.  The stent was taken up and  the artery fully inflated.  The stent was then post dilated using a 2.5  Quantum Maverick balloon.  There was an excellent angiographic  appearance.  All catheters were subsequently removed and the femoral  sheath was sewn into place.  There were no  complications.   ANGIOGRAPHIC DATA:  The LAD has been previously stented.  There is an  eccentric area of narrowing of about 40%.  The mid distal LAD has a  focal 80% stenosis just prior to a diagonal.  This was directly stented  and reduced from 80% to 0%.  There is some diffuse disease distally.  There were no complications.   CONCLUSION:  Successful percutaneous stenting of the left anterior  descending artery.   DISPOSITION:  The patient will be treated with Plavix for 4 weeks.  After 1 week, we would restart the warfarin anticoagulation.  The  patient should tolerate this reasonably well.  Plavix can be stopped at  4 weeks.  The family understands that there is increased risk of target  vessel revascularization, given his diabetes, but the chances hopefully  will be reduced.      Arturo Morton. Riley Kill, MD, [facility]  Electronically Signed     TDS/MEDQ  D:  06/22/2006  T:  06/23/2006  Job:  [id number]   cc:   Gwynneth Munson, MD,FACC  Jonelle Sidle, MD  Duke Salvia, MD,  FACC

## 2010-10-01 NOTE — Assessment & Plan Note (Signed)
[facility]                            [facility] NOTE   NAME:Clifford [name], Clifford [name]                        MRN:          [id number]  DATE:03/22/2006                            DOB:          10-08-1940    HISTORY OF PRESENT ILLNESS:  The patient is a 70 year old male with a  history of coronary artery disease, status post bare-metal stent to the LAD  and right coronary artery.  The patient is doing well.  He has a history of  atrial fibrillation.  He is in normal sinus rhythm.  He briefly cut back on  his amiodarone to 100 mg a day, but had recurrent palpitations.  He  increased it back to 200 mg a day and feels he is doing quite well.  We have  been closely following his LFTs and thyroid function studies.  They have all  been within normal limits.  He had laboratory work done a month ago, and  this was reviewed and appeared to be within normal limits.  The patient  denies any chest pain, shortness of breath, orthopnea or PND.  He will have  a Coumadin check done today.   MEDICATIONS:  1. Metformin 500 mg two tablets in the morning.  2. Lovastatin 20 mg a day.  3. Hydrochlorothiazide 12.5 daily.  4. Folic acid daily.  5. Methotrexate 10 mg Monday.  6. Amiodarone 200 mg daily.  7. Coumadin 3 mg as directed.  8. Aspirin.  9. A diabetes pill.   PHYSICAL EXAMINATION:  VITAL SIGNS:  Blood pressure 130/68, heart rate is 70  beats per minute, weight is 225 pounds.  NECK:  Normal carotid upstroke, no carotid bruits.  LUNGS:  Clear breath sounds bilaterally.  HEART:  Regular rate and rhythm, normal S1, S2, no murmurs, rubs or gallops.  ABDOMEN:  Soft.  EXTREMITIES:  No cyanosis, clubbing or edema.   PROBLEM LIST:  1. Coronary artery disease.      a.     Status post bare-metal stent to the left anterior descending and       right coronary artery in 2004.      b.     Ejection fraction 64%.      c.     Negative Cardiolite study January of 2007  with possible inferior       scar.  2. Paroxysmal atrial fibrillation.      a.     Sick sinus syndrome.      b.     Status post permanent pacemaker June 2003.      c.     Followup pacemaker check with normal sinus rhythm and amiodarone       drug therapy.  3. Coumadin anticoagulation.  INR pending.  4. Dyslipidemia.  5. Obstructive sleep apnea.  6. Chronic low back pain.   PLAN:  1. The patient's LFTs and thyroid function studies were within normal      limits.  2. The patient will follow up in 6 months.  3. The patient will have a Coumadin check done today in the  office.     Learta Codding, MD,FACC  Electronically Signed    GED/MedQ  DD: 03/22/2006  DT: 03/22/2006  Job #: [id number]

## 2010-10-01 NOTE — Cardiovascular Report (Signed)
NAMEFRANCISZEK, Clifford [name]                 ACCOUNT NO.:  [id number]   MEDICAL RECORD NO.:  [id number]          PATIENT TYPE:  OIB   LOCATION:  1962                         FACILITY:  [facility]   PHYSICIAN:  Jonelle Sidle, MD DATE OF BIRTH:  May 28, 1940   DATE OF PROCEDURE:  06/19/2006  DATE OF DISCHARGE:                            CARDIAC CATHETERIZATION   REQUESTING CARDIOLOGIST:  Dr. Berton Mount.   PRIMARY CARDIOLOGIST:  Dr. Lewayne Bunting.   INDICATIONS:  Mr. Karel is a 70 year old male with a history of type 2  diabetes mellitus, atrial fibrillation with previous bradycardia now  status post pacemaker placement and on chronic Coumadin, and coronary  artery disease managed with bare-metal stenting of the left anterior  descending and right coronary arteries back in 2004 by Dr. Chales Abrahams.  He  was recently seen by Dr. Graciela Husbands for pacemaker evaluation and described  symptoms suggestive of possible recurrent angina.  The patient is  referred now for diagnostic cardiac catheterization for reassessment of  coronary anatomy and evaluation for any potential revascularization  options.  The potential risks and benefits were explained to him in  advance and informed consent was obtained.   PROCEDURES PERFORMED:  1. Left heart catheterization  2. Selective coronary angiography.  3. Left ventriculography.   ACCESS AND EQUIPMENT:  The area about the right femoral artery was  anesthetized with 1% lidocaine and a 4-French sheath was placed in the  right femoral artery via modified Seldinger technique.  Standard  preformed 4-French JL-4 and JR-4 catheters were used for selective  coronary geography, and an angled pigtail catheter was used for left  heart catheterization and left ventriculography.  Total of 125 mL of  Omnipaque were used.  All exchanges were made over wire, and the patient  tolerated the procedure well without immediate complications.   HEMODYNAMIC RESULTS:  Aorta 123/59 mmHg, left  ventricle 124/14 mmHg.   ANGIOGRAPHIC FINDINGS:  1. Left main coronary artery is free of significant flow-limiting      coronary sclerosis and is mildly calcified.  This vessel gives rise      to the left anterior descending and circumflex coronary arteries.  2. Left anterior descending is a small to medium caliber vessel with      three diagonal branches and large septal perforator.  The stent is      visualized within the proximal portion of the vessel and preceding      this is a 50% stenosis.  In the mid vessel segment is a focal 80%      stenosis.  3. The circumflex coronary artery is a medium caliber vessel.  There      is a large proximal obtuse marginal with approximately 60-70% focal      stenosis in the proximal portion as well as 30% diffuse stenosis      distally.  4. The right coronary artery is a large caliber vessel.  Stent is      visualized within the mid portion of the vessel and was patent      followed by 30% diffuse stenosis, 20% distally.  In the proximal      portion of vessel was a more focal 30% stenosis.   Left ventriculography was performed in the RAO projection and reveals an  ejection fraction approximately 60% with no focal wall motion  abnormality and no significant mitral regurgitation.   DIAGNOSES:  1. Coronary disease as outlined.  The stents within the left anterior      descending and right coronary artery are grossly patent with      perhaps mild 20% in-stent restenosis.  Residual disease, however,      includes a focal 80% mid left anterior descending stenosis, 50%      proximal left anterior descending stenosis and a 60-70% large      obtuse marginal stenosis.  2. Left ventricular ejection fraction approximately 60% with no focal      wall motion abnormality, a left ventricular end-diastolic pressure      of 14 mmHg; no significant mitral vegetation and no significant      pullback gradient.   DISCUSSION:  I reviewed the results with the  patient and his wife.  I  also reviewed the films with Dr. Riley Kill.  At this point, we will  anticipate initiating Plavix and remaining off of Coumadin with plans to  return to the cardiac catheterization lab on Thursday for intervention  to the 80% mid left anterior descending stenosis.  Most likely, a short  bare-metal stent will be employed so as to limit Plavix use to 1 month  as Coumadin will need to be continued ongoing.  The patient will also  remain off of his metformin for the time being.  Repeat labs will be  obtained on the day of the procedure.  Further plans to follow.      Jonelle Sidle, MD  Electronically Signed     SGM/MEDQ  D:  06/19/2006  T:  06/19/2006  Job:  [id number]   cc:   Duke Salvia, MD, [facility]  Learta Codding, MD,FACC

## 2010-10-01 NOTE — Discharge Summary (Signed)
NAME:  Clifford [name], Clifford [name]                           ACCOUNT NO.:  [id number]   MEDICAL RECORD NO.:  [id number]                   PATIENT TYPE:  OIB   LOCATION:  6533                                 FACILITY:  [facility]   PHYSICIAN:  Veneda Melter, M.D. [facility]               DATE OF BIRTH:  Mar 30, 1941   DATE OF ADMISSION:  06/04/2002  DATE OF DISCHARGE:  06/05/2002                           DISCHARGE SUMMARY - REFERRING   HISTORY OF PRESENT ILLNESS:  The patient is a 70 year old white male who  began complaining of some vague left arm discomfort.  An adenosine  Cardiolite by Dr. Diona Browner on May 22, 2002, is clinically positive for  ischemia with chest discomfort and left arm discomfort.  EKG was not  diagnostic.  Imaging showed a large inferior partially reversible defect,  felt to represent possible diaphragmatic attenuation.  However, there also  appeared to be some degree of ischemia and partial reversibility.  Thus, he  was set up for an outpatient cardiac catheterization.   He also has a history of atrial fibrillation with significant pauses.  He  underwent pacemaker placement in June of 2003.  He is maintained on  amiodarone and Coumadin.  He also has a history of obesity, remote tobacco  use and hyperlipidemia.  EKG prior to admission showed normal sinus rhythm,  occasional PAC, normal axis, nonspecific STT wave changes.  Glucose 174, BUN  20, creatinine 1.0, sodium 139, potassium 4.1.  H&H 15.2 and 44.8, normal  indices, platelets 242, wbc 5.4.  PT on June 03, 2002, was 13.8, INR 1.2,  PTT 23.8.  EKG on June 04, 2002, showed atrial pacing.  Post procedure  H&H was 14.5 and 42.1.  The CK, total MB of 90 and 2.2.   HOSPITAL COURSE:  The patient was brought in for outpatient cardiac  catheterization.  This was performed by Dr. Chales Abrahams on June 04, 2002.  According to his progress note, he had a 70% proximal LAD, 50% mid LAD, 50%  mid circumflex, 40% proximal RCA, 80% mid RCA.   Utilizing express stents,  the mid RCA was reduced from 80% to 0% and proximal LAD from 70% to 0%.  His  EF was 55%.  Dr. Chales Abrahams recommended Plavix for one month and to resume his  Coumadin on June 05, 2002.  During bedrest, the patient did not comply  with recommended bed rest.  He continued to get out of bed and disobey  nursing instructions.  On the morning of June 05, 2002, after review, Dr.  Chales Abrahams felt that his catheterization site was intact and felt that he could  be discharged home.  He felt that he did not need to have a therapeutic INR  prior to discharge.   DIAGNOSES:  1. Unstable angina with positive adenosine Cardiolite representing inferior     ischemia, status post angioplasty and stenting to the mid right coronary  artery and the proximal left anterior descending as previously described.  2. History of atrial fibrillation.  3. Status post pacemaker.  4. Hyperlipidemia.   DISPOSITION:  The patient was discharged home after he was seen by cardiac  rehab.   MEDICATIONS:  His new medications include:  1. Plavix 75 mg q.d. for one month.  Once the Plavix was discontinued, he     was asked to begin baby aspirin 81 mg q.d.  2. He was asked to continue his Coumadin 3 mg q.d. except 6 mg on Monday,     Tuesday, and Wednesday.  3. The remainder of his medications remain the same.  These include:     a. Lipitor 10 mg q.h.s.     b. Hydrochlorothiazide 12.5 b.i.d.     c. Amiodarone 200 q.d.     d. Folic acid 1 mg q.d.     e. Methotrexate 2.5 mg     f. Celebrex 200 mg q.d.     g. Nitroglycerin 0.4 as needed.   ACTIVITY:  No lifting, driving, sexual activity or heavy exertion for two  days.   DIET:  Maintain a low salt, fat, and cholesterol diet.   DISCHARGE INSTRUCTIONS:  If he has any problems with his catheterization  site, he was asked to call.   FOLLOW UP:  He will have a PT/INR checked on Wednesday, June 12, 2002, at  9 a.m. and will follow up with Dr.  Diona Browner on June 21, 2002, at 10:30  a.m.     Joellyn Rued, P.A. [facility]                    Veneda Melter, M.D. [facility]    EW/MEDQ  D:  06/05/2002  T:  06/05/2002  Job:  [id number]   cc:   Jonelle Sidle, M.D. [facility]  [address]  [place]  [place] [address]  Fax: 1   Marcelino Duster  [address] [place]  [place] [address]  Fax: [phone]

## 2010-10-01 NOTE — Procedures (Signed)
[place]  Patient:    Clifford [name], Clifford [name] Visit Number: [id number] MRN: [id number]          Service Type: MED Location: [facility] 2931 [id number] Attending Physician:  Junious Silk Dictated by:   Daisey Must, M.D. [facility] Proc. Date: 11/03/01 Admit Date:  11/02/2001                             Procedure Report  PROCEDURES PERFORMED: Temporary pacemaker placement via the right internal jugular artery.  INDICATIONS: The patient is a 70 year old male with atrial flutter and near-syncope. He has had significant positives as well as tachycardia. I was asked by Dr. Daleen Squibb to assist in placement of a temporary transvenous pacemaker because of his prolonged ventricular pauses.  DESCRIPTION OF PROCEDURE: The skin in the right jugular region was prepped and draped in the sterile fashion. Then 1% lidocaine was infiltrated subcutaneously. The patient had somewhat difficult anatomy and after multiple attempts, the internal jugular vein was located with a finder needle. The vein was then cannulated and a introducer sheath was introduced into the vein via the modified Seldinger technique. A temporary transvenous pacemaker was then floated under fluoroscopic guidance into the right ventricle apex. Initial attempts at ventricular sensing and capture were unsuccessful with three different pacemaker boxes. We therefore removed this pacing wire which appeared to be defective. We floated a second temporary pacemaker making wire under fluoroscopic guidance into the right ventricular apex with fluoroscopic guidance. We were able to achieve excellent ventricular sensing and capture with this wire with a pacing threshold of between 0.5 and 1.0 mA. The sheath and pacemaker were then secured in place and pacemaker was fit at an output of 5 mA back at rate of 60.  The patient tolerated the procedure well and there were no apparent complications. Chest x-ray is pending at this  time. Dictated by:   Daisey Must, M.D. [facility] Attending Physician:  Junious Silk DD:  11/03/01 TD:  11/05/01 Job: [id number]

## 2010-10-01 NOTE — Assessment & Plan Note (Signed)
[facility]                          [facility] NOTE   NAME:Clifford [name], Clifford [name]                        MRN:          [id number]  DATE:06/27/2006                            DOB:          12/19/1940    HISTORY OF PRESENT ILLNESS:  The patient is a 70 year old male status  post pacemaker implantation and stents in both LAD and the right  coronary artery.  The patient has had an angina equivalent, and  catheterization was recently done which showed high-grade stenosis in  the mid distal portion of the LAD.  The patient underwent successful  percutaneous stenting of the LAD.  This was a non-drug-eluding stent.  The patient now presents for follow up.  He has been otherwise doing  well.  He reports no chest pain, orthopnea, PND, palpitations or  syncope.   The patient has not started on Coumadin yet.  He has a history of  paroxysmal atrial fibrillation.   MEDICATIONS:  1. Metformin 5 mg two tablets in the morning.  2. Lovastatin 20 mg p.o. daily.  3. Hydrochlorothiazide 12.5 daily.  4. Folic acid.  5. Methotrexate.  6. Amiodarone 20 mg p.o. daily.  7. Coumadin as directed.  8. Aspirin 81 mg daily.  9. Diabetes pill.   PHYSICAL EXAMINATION:  VITAL SIGNS:  Blood pressure 160/80, heart rate  70 beats per minute.  NECK:  Normal carotid upstroke.  No femoral bruits.  LUNGS:  Clear.  HEART:  Regular rate and rhythm.  Normal S1 and S2.  ABDOMEN:  Soft.  EXTREMITIES:  No clubbing, cyanosis, or edema.   ASSESSMENT:  1. Coronary artery disease.      a.     Status post bare metal stent to the left anterior descending       and right coronary artery in 2004.      b.     Repeat bare metal stent placement in the left anterior       descending in February of 2007.      c.     History of ejection fraction of 64%.  2. Paroxysmal atrial fibrillation.      a.     Sick sinus syndrome.      b.     Status post permanent pacemaker implantation in 2003.   c.     Amiodarone drug therapy for maintenance of sinus rhythm.      d.     Coumadin anticoagulation.  3. Dyslipidemia.  4. Obstructive sleep apnea.  5. Chronic low back pain.   PLAN:  The patient will have a BMET, PT and INR done today.  Coumadin  will be restarted on Friday, later this week.  Plavix was recommended  for 1 month.  The patient was also given lisinopril 20 mg p.o. daily due  to his hypertension.  The patient will follow up with me in the next  couple months.     Learta Codding, MD,FACC  Electronically Signed    GED/MedQ  DD: 07/03/2006  DT: 07/03/2006  Job #: [id number]

## 2010-10-12 ENCOUNTER — Other Ambulatory Visit: Payer: Self-pay | Admitting: *Deleted

## 2010-10-12 DIAGNOSIS — Z79899 Other long term (current) drug therapy: Secondary | ICD-10-CM

## 2010-10-12 DIAGNOSIS — R7989 Other specified abnormal findings of blood chemistry: Secondary | ICD-10-CM

## 2010-10-21 ENCOUNTER — Telehealth: Payer: Self-pay | Admitting: *Deleted

## 2010-10-21 NOTE — Progress Notes (Signed)
Patient notified & verbalized understanding.  Copy faxed to Dr. Sasser.   

## 2010-10-21 NOTE — Telephone Encounter (Signed)
Message copied by Murriel Hopper on Thu Oct 21, 2010  9:40 AM ------      Message from: Lewayne Bunting E      Created: Wed Oct 13, 2010  3:17 PM       Stable. Emphysema noted. Further followup with primary care physician on this issue. No evidence of malignancy

## 2010-10-21 NOTE — Telephone Encounter (Signed)
Patient notified & verbalized understanding.  Copy faxed to Dr. Neita Carp.

## 2010-12-06 DIAGNOSIS — R42 Dizziness and giddiness: Secondary | ICD-10-CM

## 2010-12-09 ENCOUNTER — Encounter: Payer: Self-pay | Admitting: Internal Medicine

## 2010-12-09 DIAGNOSIS — I495 Sick sinus syndrome: Secondary | ICD-10-CM

## 2010-12-23 ENCOUNTER — Ambulatory Visit (INDEPENDENT_AMBULATORY_CARE_PROVIDER_SITE_OTHER): Payer: PRIVATE HEALTH INSURANCE | Admitting: Cardiology

## 2010-12-23 ENCOUNTER — Encounter: Payer: Self-pay | Admitting: Cardiology

## 2010-12-23 VITALS — BP 129/67 | HR 60 | Ht 71.0 in | Wt 212.0 lb

## 2010-12-23 DIAGNOSIS — I2510 Atherosclerotic heart disease of native coronary artery without angina pectoris: Secondary | ICD-10-CM

## 2010-12-23 DIAGNOSIS — Z7901 Long term (current) use of anticoagulants: Secondary | ICD-10-CM

## 2010-12-23 DIAGNOSIS — I4891 Unspecified atrial fibrillation: Secondary | ICD-10-CM

## 2010-12-23 DIAGNOSIS — I959 Hypotension, unspecified: Secondary | ICD-10-CM

## 2010-12-23 NOTE — Patient Instructions (Signed)
Continue all current medications. Your physician wants you to follow up in: 6 months.  You will receive a reminder letter in the mail one-two months in advance.  If you don't receive a letter, please call our office to schedule the follow up appointment   

## 2010-12-24 ENCOUNTER — Other Ambulatory Visit: Payer: Self-pay

## 2010-12-24 ENCOUNTER — Telehealth: Payer: Self-pay

## 2010-12-24 MED ORDER — HYDROCHLOROTHIAZIDE 25 MG PO TABS: 25.0000 mg | ORAL_TABLET | Freq: Every day | ORAL | Status: DC

## 2010-12-24 NOTE — Telephone Encounter (Signed)
.   Requested Prescriptions   Signed Prescriptions Disp Refills  . hydrochlorothiazide 25 MG tablet 30 tablet 6    Sig: Take 1 tablet (25 mg total) by mouth daily.    Authorizing Provider: DE Karma Lew    Ordering User: Lacie Scotts

## 2010-12-25 DIAGNOSIS — I959 Hypotension, unspecified: Secondary | ICD-10-CM | POA: Insufficient documentation

## 2010-12-25 DIAGNOSIS — Z7901 Long term (current) use of anticoagulants: Secondary | ICD-10-CM | POA: Insufficient documentation

## 2010-12-25 NOTE — Assessment & Plan Note (Signed)
No recurrent chest pain. No indication for ischemia workup last catheterization was in 2008 with prior drug-eluting stent placements as detailed above.

## 2010-12-25 NOTE — Assessment & Plan Note (Signed)
Patient reports no complications. Continue medical therapy and Coumadin followup

## 2010-12-25 NOTE — Progress Notes (Signed)
HPI  the patient is a 70 year old male with a history of coronary artery disease, status post prior stent placement to the LAD and right coronary artery including drug-eluting stents to the LAD in 2008 most recently. The patient has type 2 diabetes mellitus paroxysmal atrial fibrillation on amiodarone therapy and Coumadin. Has sick sinus syndrome and status post pacemaker implantation. He also has hypertension and rheumatoid arthritis. His followup transtelephonic telemetry for his pacemaker by Dr. Dr. Johney Frame. He has a Medtronic device with an estimated battery life of 6 years. The patient was recently hospitalized with intermittent dizziness related to hypotension and dehydration. Lisinopril was temporarily discontinued and the patient was rehydrated. He has had no further problems. He reports no recurrent symptoms. He has no chest pain shortness of breath orthopnea PND   Allergies  Allergen Reactions  . Demerol Nausea And Vomiting    Current Outpatient Prescriptions on File Prior to Visit  Medication Sig Dispense Refill  . amiodarone (PACERONE) 200 MG tablet Take 200 mg by mouth daily.        Marland Kitchen aspirin 81 MG tablet Take 81 mg by mouth daily.        . Folic Acid 20 MG CAPS Take 1 capsule by mouth daily.        Marland Kitchen glipiZIDE (GLUCOTROL) 10 MG tablet Take 10 mg by mouth daily.       . insulin glargine (LANTUS) 100 UNIT/ML injection Inject 60 Units into the skin at bedtime.       Marland Kitchen lisinopril (PRINIVIL,ZESTRIL) 5 MG tablet Take 5 mg by mouth daily.        Marland Kitchen lovastatin (MEVACOR) 40 MG tablet Take 60 mg by mouth at bedtime.        . metFORMIN (GLUCOPHAGE) 500 MG tablet Take 1,000 mg by mouth 2 (two) times daily with a meal.        . methotrexate (RHEUMATREX) 10 MG tablet Take 10 mg by mouth once a week. Caution: Chemotherapy. Protect from light.      . nitroGLYCERIN (NITROSTAT) 0.4 MG SL tablet Place 0.4 mg under the tongue every 5 (five) minutes as needed.        . warfarin (COUMADIN) 3 MG tablet  Take 3 mg by mouth as directed. Per Dr. Neita Carp office        Past Medical History  Diagnosis Date  . Atrial fibrillation     Patient on amiodarone  . Unspecified essential hypertension   . Type II or unspecified type diabetes mellitus without mention of complication, not stated as uncontrolled   . Rheumatoid arthritis   . CAD (coronary artery disease)     Past Surgical History  Procedure Date  . Pacemaker insertion     No family history on file.  History   Social History  . Marital Status: Married    Spouse Name: N/A    Number of Children: N/A  . Years of Education: N/A   Occupational History  . RETIRED    Social History Main Topics  . Smoking status: Former Smoker -- 2.0 packs/day for 50 years    Types: Cigarettes    Quit date: 05/17/1995  . Smokeless tobacco: Never Used   Comment:   Year Quit: 1997  . Alcohol Use: No  . Drug Use: No  . Sexually Active: Not on file   Other Topics Concern  . Not on file   Social History Narrative  . No narrative on file   [id number] positives as outlined above. The  remainder of the 18  point review of systems is negative  PHYSICAL EXAM BP 129/67  Pulse 60  Ht 5\' 11"  (1.803 m)  Wt 212 lb (96.163 kg)  BMI 29.57 kg/m2  General: Well-developed, well-nourished in no distress Head: Normocephalic and atraumatic Eyes:PERRLA/EOMI intact, conjunctiva and lids normal Ears: No deformity or lesions Mouth:normal dentition, normal posterior pharynx Neck: Supple, no JVD.  No masses, thyromegaly or abnormal cervical nodes Lungs: Normal breath sounds bilaterally without wheezing.  Normal percussion Cardiac: regular rate and rhythm with normal S1 and S2, no S3 or S4.  PMI is normal.  No pathological murmurs Abdomen: Normal bowel sounds, abdomen is soft and nontender without masses, organomegaly or hernias noted.  No hepatosplenomegaly MSK: Back normal, normal gait muscle strength and tone normal Vascular: Pulse is normal in all 4  extremities Extremities: No peripheral pitting edema Neurologic: Alert and oriented x 3 Skin: Intact without lesions or rashes Lymphatics: No significant adenopathy Psychologic: Normal affect   ECG: Not available  ASSESSMENT AND PLAN

## 2010-12-25 NOTE — Assessment & Plan Note (Signed)
Status post dehydration now resolved after IV fluids and hospitalization. Patient remains on low-dose lisinopril as well as hydrochlorothiazide for hypertension and is now tolerating this

## 2010-12-25 NOTE — Assessment & Plan Note (Signed)
Patient remains in normal sinus rhythm is on amiodarone as well as Coumadin therapy.

## 2010-12-28 ENCOUNTER — Other Ambulatory Visit: Payer: Self-pay | Admitting: *Deleted

## 2010-12-28 MED ORDER — HYDROCHLOROTHIAZIDE 25 MG PO TABS: 25.0000 mg | ORAL_TABLET | Freq: Every day | ORAL | Status: DC

## 2011-01-20 ENCOUNTER — Encounter: Payer: Self-pay | Admitting: *Deleted

## 2011-03-10 ENCOUNTER — Encounter: Payer: Self-pay | Admitting: Internal Medicine

## 2011-03-10 DIAGNOSIS — I459 Conduction disorder, unspecified: Secondary | ICD-10-CM

## 2011-04-26 ENCOUNTER — Encounter: Payer: Self-pay | Admitting: Internal Medicine

## 2011-06-03 ENCOUNTER — Other Ambulatory Visit: Payer: Self-pay | Admitting: *Deleted

## 2011-06-03 MED ORDER — AMIODARONE HCL 200 MG PO TABS: 200.0000 mg | ORAL_TABLET | Freq: Every day | ORAL | Status: DC

## 2011-06-09 ENCOUNTER — Encounter: Payer: Self-pay | Admitting: Internal Medicine

## 2011-06-09 DIAGNOSIS — I495 Sick sinus syndrome: Secondary | ICD-10-CM

## 2011-06-22 ENCOUNTER — Telehealth: Payer: Self-pay | Admitting: Cardiology

## 2011-06-22 NOTE — Telephone Encounter (Signed)
04-26-11 lmm for pt to cal to set up pacemaker ck, also sent letter/mt 06-22-11 spoke to pt's wife, pt not in, will have him call the [place] office to set up pacer ck/mt

## 2011-08-05 ENCOUNTER — Encounter: Payer: Self-pay | Admitting: Internal Medicine

## 2011-08-05 ENCOUNTER — Ambulatory Visit (INDEPENDENT_AMBULATORY_CARE_PROVIDER_SITE_OTHER): Payer: PRIVATE HEALTH INSURANCE | Admitting: *Deleted

## 2011-08-05 DIAGNOSIS — I4891 Unspecified atrial fibrillation: Secondary | ICD-10-CM

## 2011-08-05 DIAGNOSIS — Z950 Presence of cardiac pacemaker: Secondary | ICD-10-CM

## 2011-08-05 LAB — PACEMAKER DEVICE OBSERVATION
AL AMPLITUDE: 2.8 mv
AL IMPEDENCE PM: 515 Ohm
ATRIAL PACING PM: 41.4
RV LEAD IMPEDENCE PM: 682 Ohm
VENTRICULAR PACING PM: 7.7

## 2011-08-05 NOTE — Progress Notes (Signed)
Pacer check in clinic  

## 2011-08-19 ENCOUNTER — Other Ambulatory Visit: Payer: Self-pay | Admitting: *Deleted

## 2011-08-19 MED ORDER — HYDROCHLOROTHIAZIDE 25 MG PO TABS: 25.0000 mg | ORAL_TABLET | Freq: Every day | ORAL | Status: DC

## 2011-10-17 DIAGNOSIS — I495 Sick sinus syndrome: Secondary | ICD-10-CM

## 2012-01-17 DIAGNOSIS — I495 Sick sinus syndrome: Secondary | ICD-10-CM

## 2012-01-26 ENCOUNTER — Other Ambulatory Visit: Payer: Self-pay | Admitting: Cardiology

## 2012-01-26 MED ORDER — AMIODARONE HCL 200 MG PO TABS: 200.0000 mg | ORAL_TABLET | Freq: Every day | ORAL | Status: DC

## 2012-02-27 ENCOUNTER — Other Ambulatory Visit: Payer: Self-pay | Admitting: Physician Assistant

## 2012-03-05 ENCOUNTER — Telehealth: Payer: Self-pay | Admitting: *Deleted

## 2012-03-05 NOTE — Telephone Encounter (Signed)
03-05-12 LM WITH PT'S WIFE 351PM FOR PT TO SET UP PACER CHECK WITH DEVICE CLINIC IN [place]

## 2012-03-12 ENCOUNTER — Other Ambulatory Visit: Payer: Self-pay | Admitting: Physician Assistant

## 2012-03-23 ENCOUNTER — Encounter: Payer: Self-pay | Admitting: *Deleted

## 2012-03-29 ENCOUNTER — Other Ambulatory Visit: Payer: Self-pay | Admitting: *Deleted

## 2012-03-29 ENCOUNTER — Ambulatory Visit (INDEPENDENT_AMBULATORY_CARE_PROVIDER_SITE_OTHER): Payer: PRIVATE HEALTH INSURANCE | Admitting: *Deleted

## 2012-03-29 DIAGNOSIS — Z950 Presence of cardiac pacemaker: Secondary | ICD-10-CM

## 2012-03-29 DIAGNOSIS — I4891 Unspecified atrial fibrillation: Secondary | ICD-10-CM

## 2012-03-29 LAB — PACEMAKER DEVICE OBSERVATION
AL AMPLITUDE: 2.8 mv
AL IMPEDENCE PM: 507 Ohm
BATTERY VOLTAGE: 2.77 V
RV LEAD IMPEDENCE PM: 556 Ohm
VENTRICULAR PACING PM: 35.1

## 2012-03-29 MED ORDER — AMIODARONE HCL 200 MG PO TABS: 200.0000 mg | ORAL_TABLET | Freq: Every day | ORAL | Status: DC

## 2012-03-29 NOTE — Progress Notes (Signed)
Pacer check in clinic  

## 2012-03-30 ENCOUNTER — Other Ambulatory Visit: Payer: Self-pay | Admitting: Cardiology

## 2012-04-02 ENCOUNTER — Other Ambulatory Visit: Payer: Self-pay | Admitting: Physician Assistant

## 2012-04-05 ENCOUNTER — Encounter: Payer: Self-pay | Admitting: Cardiology

## 2012-04-05 ENCOUNTER — Ambulatory Visit (INDEPENDENT_AMBULATORY_CARE_PROVIDER_SITE_OTHER): Payer: PRIVATE HEALTH INSURANCE | Admitting: Cardiology

## 2012-04-05 VITALS — BP 146/70 | HR 79 | Ht 71.0 in | Wt 225.1 lb

## 2012-04-05 DIAGNOSIS — I2510 Atherosclerotic heart disease of native coronary artery without angina pectoris: Secondary | ICD-10-CM

## 2012-04-05 DIAGNOSIS — I10 Essential (primary) hypertension: Secondary | ICD-10-CM

## 2012-04-05 DIAGNOSIS — I4891 Unspecified atrial fibrillation: Secondary | ICD-10-CM

## 2012-04-05 DIAGNOSIS — Z950 Presence of cardiac pacemaker: Secondary | ICD-10-CM

## 2012-04-05 DIAGNOSIS — E785 Hyperlipidemia, unspecified: Secondary | ICD-10-CM

## 2012-04-05 MED ORDER — METOPROLOL SUCCINATE ER 25 MG PO TB24: 25.0000 mg | ORAL_TABLET | Freq: Every day | ORAL | Status: DC

## 2012-04-05 NOTE — Assessment & Plan Note (Signed)
No recent symptoms. Discontinue aspirin given need for Coumadin. Continue statin. We discussed a functional study but the patient refused.

## 2012-04-05 NOTE — Assessment & Plan Note (Signed)
Blood pressure is elevated. Add Toprol 25 mg daily. 

## 2012-04-05 NOTE — Assessment & Plan Note (Signed)
Continue statin. 

## 2012-04-05 NOTE — Assessment & Plan Note (Addendum)
Patient has developed recurrent atrial fibrillation but is asymptomatic. He has not taken his amiodarone in approximately one and one half months. Given that he is asymptomatic I think rate control and anticoagulation would be appropriate. His Coumadin is being monitored by Dr. Neita Carp. He states he is not sure if he will have it checked every 4 weeks. I stressed the importance of INR checks. I explained the risk of stroke if his INR is subtherapeutic. I explained the risk of bleeding if supratherapeutic. He states he will have his Coumadin checked and followup with Dr. Neita Carp. We will leave him off of amiodarone. Add Toprol 25 mg daily. Patient will return in 8 weeks to make sure that his heart rate is not increasing as amiodarone washes out of his system. Patient not interested in discussing one of the newer anticoagulants.

## 2012-04-05 NOTE — Progress Notes (Signed)
HPI: Pleasant male for fu of CAD and atrial fibrillation; history of coronary artery disease, status post prior stent placement to the LAD and right coronary artery including drug-eluting stents to the LAD in 2008 most recently. The patient has paroxysmal atrial fibrillation on amiodarone therapy and Coumadin. Has sick sinus syndrome and status post pacemaker implantation. He also has hypertension and rheumatoid arthritis. Pacemaker followed by Dr Johney Frame. Since he was last seen he has some dyspnea on exertion which is unchanged. No orthopnea or PND, palpitations, syncope or chest pain. Occasional mild pedal edema.   Current Outpatient Prescriptions  Medication Sig Dispense Refill  . amiodarone (PACERONE) 200 MG tablet Take 1 tablet (200 mg total) by mouth daily.  30 tablet  1  . aspirin 81 MG tablet Take 81 mg by mouth daily.        . Folic Acid 20 MG CAPS Take 1 capsule by mouth daily.        Marland Kitchen glipiZIDE (GLUCOTROL) 10 MG tablet Take 10 mg by mouth daily.       . hydrochlorothiazide (HYDRODIURIL) 25 MG tablet TAKE ONE TABLET BY MOUTH ONCE EVERY DAY  30 tablet  0  . HYDROcodone-acetaminophen (VICODIN) 5-500 MG per tablet Take 1 tablet by mouth every 8 (eight) hours as needed.       . insulin glargine (LANTUS) 100 UNIT/ML injection Inject 60 Units into the skin at bedtime.       Marland Kitchen lisinopril (PRINIVIL,ZESTRIL) 5 MG tablet Take 5 mg by mouth daily.        Marland Kitchen lovastatin (MEVACOR) 40 MG tablet Take 40 mg by mouth at bedtime.       . metFORMIN (GLUCOPHAGE) 500 MG tablet Take 1,000 mg by mouth 2 (two) times daily with a meal.        . methotrexate (RHEUMATREX) 10 MG tablet Take 40 mg by mouth once a week. Caution: Chemotherapy. Protect from light.      . nitroGLYCERIN (NITROSTAT) 0.4 MG SL tablet Place 0.4 mg under the tongue every 5 (five) minutes as needed.        . warfarin (COUMADIN) 3 MG tablet Take 3 mg by mouth as directed. Per Dr. Neita Carp office      . [DISCONTINUED] amiodarone (PACERONE) 200  MG tablet TAKE 1 TABLET (200 MG TOTAL)  BY MOUTH DAILY.  30 tablet  0     Past Medical History  Diagnosis Date  . Atrial fibrillation     Patient on amiodarone  . Unspecified essential hypertension   . Type II or unspecified type diabetes mellitus without mention of complication, not stated as uncontrolled   . Rheumatoid arthritis   . CAD (coronary artery disease)     Past Surgical History  Procedure Date  . Pacemaker insertion     History   Social History  . Marital Status: Married    Spouse Name: N/A    Number of Children: N/A  . Years of Education: N/A   Occupational History  . RETIRED    Social History Main Topics  . Smoking status: Former Smoker -- 2.0 packs/day for 50 years    Types: Cigarettes    Quit date: 05/17/1995  . Smokeless tobacco: Never Used     Comment:   Year Quit: 1997  . Alcohol Use: No  . Drug Use: No  . Sexually Active: Not on file   Other Topics Concern  . Not on file   Social History Narrative  . No narrative on  file    ROS: no fevers or chills, productive cough, hemoptysis, dysphasia, odynophagia, melena, hematochezia, dysuria, hematuria, rash, seizure activity, orthopnea, PND, claudication. Remaining systems are negative.  Physical Exam: Well-developed well-nourished in no acute distress.  Skin is warm and dry.  HEENT is normal.  Neck is supple.  Chest is clear to auscultation with normal expansion.  Cardiovascular exam is irregular Abdominal exam nontender or distended. No masses palpated. Extremities show no edema. neuro grossly intact  ECG atrial fibrillation at a rate of 79 with intermittent ventricular pacing. Left axis deviation. Cannot rule out prior anterior infarct versus precordial lead reversal.

## 2012-04-05 NOTE — Patient Instructions (Addendum)
   Stop Amiodarone  Stop Aspirin  Begin Toprol XL 25mg  daily Continue all other current medications. Follow up in  8 weeks

## 2012-04-05 NOTE — Assessment & Plan Note (Signed)
Management per electrophysiology. 

## 2012-04-17 ENCOUNTER — Encounter: Payer: Self-pay | Admitting: Internal Medicine

## 2012-04-17 DIAGNOSIS — I495 Sick sinus syndrome: Secondary | ICD-10-CM

## 2012-05-07 ENCOUNTER — Other Ambulatory Visit: Payer: Self-pay | Admitting: Physician Assistant

## 2012-05-17 ENCOUNTER — Telehealth: Payer: Self-pay | Admitting: Internal Medicine

## 2012-05-17 ENCOUNTER — Encounter: Payer: PRIVATE HEALTH INSURANCE | Admitting: Cardiology

## 2012-05-17 NOTE — Telephone Encounter (Signed)
Patient walked in at appointment time and told me to cancel all appointments with [place]. I confirmed that he wanted EP cancelled also and he said Yes. [facility] will be handling all his needs

## 2012-05-17 NOTE — Progress Notes (Signed)
HPI: 72 yo male for fu of CAD and atrial fibrillation; history of coronary artery disease, status post prior stent placement to the LAD and right coronary artery including drug-eluting stents to the LAD in 2008 most recently. The patient has paroxysmal atrial fibrillation and had been on amiodarone therapy and Coumadin. Has sick sinus syndrome and status post pacemaker implantation. He also has hypertension and rheumatoid arthritis. Pacemaker followed by Dr Johney Frame. When I last saw him in November of 2013 the patient had not been taking his amiodarone for approximately one and one half months. He was back in atrial fibrillation but was asymptomatic. We elected rate control and anticoagulation. I left him off of his amiodarone and add Toprol 25 mg daily. He was not interested in repeat functional study. Since I last saw him,   Current Outpatient Prescriptions  Medication Sig Dispense Refill  . Folic Acid 20 MG CAPS Take 1 capsule by mouth daily.        Marland Kitchen glipiZIDE (GLUCOTROL) 10 MG tablet Take 10 mg by mouth daily.       . hydrochlorothiazide (HYDRODIURIL) 25 MG tablet TAKE ONE TABLET BY MOUTH ONCE EVERY DAY  30 tablet  2  . HYDROcodone-acetaminophen (VICODIN) 5-500 MG per tablet Take 1 tablet by mouth every 8 (eight) hours as needed.       . insulin glargine (LANTUS) 100 UNIT/ML injection Inject 60 Units into the skin at bedtime.       Marland Kitchen lisinopril (PRINIVIL,ZESTRIL) 5 MG tablet Take 5 mg by mouth daily.        Marland Kitchen lovastatin (MEVACOR) 40 MG tablet Take 40 mg by mouth at bedtime.       . metFORMIN (GLUCOPHAGE) 500 MG tablet Take 1,000 mg by mouth 2 (two) times daily with a meal.        . methotrexate (RHEUMATREX) 10 MG tablet Take 40 mg by mouth once a week. Caution: Chemotherapy. Protect from light.      . metoprolol succinate (TOPROL-XL) 25 MG 24 hr tablet Take 1 tablet (25 mg total) by mouth daily.  30 tablet  6  . nitroGLYCERIN (NITROSTAT) 0.4 MG SL tablet Place 0.4 mg under the tongue every 5  (five) minutes as needed.        . warfarin (COUMADIN) 3 MG tablet Take 3 mg by mouth as directed. Per Dr. Neita Carp office         Past Medical History  Diagnosis Date  . Atrial fibrillation     Patient on amiodarone  . Unspecified essential hypertension   . Type II or unspecified type diabetes mellitus without mention of complication, not stated as uncontrolled   . Rheumatoid arthritis   . CAD (coronary artery disease)     Past Surgical History  Procedure Date  . Pacemaker insertion     History   Social History  . Marital Status: Married    Spouse Name: N/A    Number of Children: N/A  . Years of Education: N/A   Occupational History  . RETIRED    Social History Main Topics  . Smoking status: Former Smoker -- 2.0 packs/day for 50 years    Types: Cigarettes    Quit date: 05/17/1995  . Smokeless tobacco: Never Used     Comment:   Year Quit: 1997  . Alcohol Use: No  . Drug Use: No  . Sexually Active: Not on file   Other Topics Concern  . Not on file   Social History Narrative  .  No narrative on file    ROS: no fevers or chills, productive cough, hemoptysis, dysphasia, odynophagia, melena, hematochezia, dysuria, hematuria, rash, seizure activity, orthopnea, PND, pedal edema, claudication. Remaining systems are negative.  Physical Exam: Well-developed well-nourished in no acute distress.  Skin is warm and dry.  HEENT is normal.  Neck is supple.  Chest is clear to auscultation with normal expansion.  Cardiovascular exam is regular rate and rhythm.  Abdominal exam nontender or distended. No masses palpated. Extremities show no edema. neuro grossly intact  ECG     This encounter was created in error - please disregard.

## 2012-07-17 ENCOUNTER — Encounter: Payer: Self-pay | Admitting: Internal Medicine

## 2012-07-17 DIAGNOSIS — I495 Sick sinus syndrome: Secondary | ICD-10-CM

## 2012-07-25 ENCOUNTER — Encounter: Payer: PRIVATE HEALTH INSURANCE | Admitting: Internal Medicine

## 2012-08-07 ENCOUNTER — Other Ambulatory Visit: Payer: Self-pay | Admitting: Physician Assistant

## 2012-10-16 DIAGNOSIS — I495 Sick sinus syndrome: Secondary | ICD-10-CM

## 2013-01-15 DIAGNOSIS — I495 Sick sinus syndrome: Secondary | ICD-10-CM

## 2013-02-01 ENCOUNTER — Other Ambulatory Visit: Payer: Self-pay | Admitting: Cardiology

## 2013-02-19 ENCOUNTER — Encounter: Payer: Self-pay | Admitting: Internal Medicine

## 2013-03-11 ENCOUNTER — Other Ambulatory Visit: Payer: Self-pay | Admitting: Cardiology

## 2013-12-13 ENCOUNTER — Encounter: Payer: Self-pay | Admitting: Cardiovascular Disease

## 2013-12-13 ENCOUNTER — Ambulatory Visit (INDEPENDENT_AMBULATORY_CARE_PROVIDER_SITE_OTHER): Payer: PRIVATE HEALTH INSURANCE | Admitting: Cardiovascular Disease

## 2013-12-13 ENCOUNTER — Encounter: Payer: Self-pay | Admitting: *Deleted

## 2013-12-13 ENCOUNTER — Ambulatory Visit (INDEPENDENT_AMBULATORY_CARE_PROVIDER_SITE_OTHER): Payer: PRIVATE HEALTH INSURANCE | Admitting: *Deleted

## 2013-12-13 VITALS — BP 117/74 | HR 75 | Ht 71.0 in | Wt 202.0 lb

## 2013-12-13 DIAGNOSIS — I4891 Unspecified atrial fibrillation: Secondary | ICD-10-CM

## 2013-12-13 DIAGNOSIS — I25118 Atherosclerotic heart disease of native coronary artery with other forms of angina pectoris: Secondary | ICD-10-CM

## 2013-12-13 DIAGNOSIS — E785 Hyperlipidemia, unspecified: Secondary | ICD-10-CM

## 2013-12-13 DIAGNOSIS — R0989 Other specified symptoms and signs involving the circulatory and respiratory systems: Secondary | ICD-10-CM

## 2013-12-13 DIAGNOSIS — I10 Essential (primary) hypertension: Secondary | ICD-10-CM

## 2013-12-13 DIAGNOSIS — R5381 Other malaise: Secondary | ICD-10-CM

## 2013-12-13 DIAGNOSIS — I480 Paroxysmal atrial fibrillation: Secondary | ICD-10-CM

## 2013-12-13 DIAGNOSIS — I2510 Atherosclerotic heart disease of native coronary artery without angina pectoris: Secondary | ICD-10-CM

## 2013-12-13 DIAGNOSIS — I209 Angina pectoris, unspecified: Secondary | ICD-10-CM

## 2013-12-13 DIAGNOSIS — Z950 Presence of cardiac pacemaker: Secondary | ICD-10-CM

## 2013-12-13 DIAGNOSIS — R5383 Other fatigue: Secondary | ICD-10-CM

## 2013-12-13 DIAGNOSIS — R0609 Other forms of dyspnea: Secondary | ICD-10-CM

## 2013-12-13 LAB — MDC_IDC_ENUM_SESS_TYPE_INCLINIC
Battery Impedance: 2411 Ohm
Battery Voltage: 2.75 V
Brady Statistic AS VS Percent: 18.8 %
Lead Channel Impedance Value: 586 Ohm
Lead Channel Pacing Threshold Amplitude: 1.5 V
Lead Channel Pacing Threshold Pulse Width: 0.4 ms
Lead Channel Sensing Intrinsic Amplitude: 2.8 mV
Lead Channel Setting Pacing Amplitude: 2 V
Lead Channel Setting Pacing Pulse Width: 0.4 ms
Lead Channel Setting Sensing Sensitivity: 2.8 mV
MDC IDC MSMT BATTERY REMAINING LONGEVITY: 29 mo
MDC IDC MSMT LEADCHNL RA IMPEDANCE VALUE: 564 Ohm
MDC IDC MSMT LEADCHNL RV SENSING INTR AMPL: 11.2 mV
MDC IDC SESS DTM: 20150731113652
MDC IDC SET LEADCHNL RV PACING AMPLITUDE: 3 V
MDC IDC STAT BRADY RA PERCENT PACED: 66.6 %
MDC IDC STAT BRADY RV PERCENT PACED: 1.7 %

## 2013-12-13 NOTE — Progress Notes (Signed)
Pacemaker check in clinic. Normal device function. Battery longevity 29 months with range of 11 to 46 months. Pt in AF 62.2% of time. + Warfarin. Underlying AF w/VS. Changed RV output from 2.50 to 3.00 due to increase in RV threshold 1.50 @ 0.40 ms. ROV in 6 mths w/JA in [place].

## 2013-12-13 NOTE — Patient Instructions (Signed)
Your physician has requested that you have a lexiscan myoview. For further information please visit [email or web address]. Please follow instruction sheet, as given. Office will contact with results via phone or letter.   Continue all current medications. Follow up in  4-6 weeks

## 2013-12-13 NOTE — Progress Notes (Signed)
Patient ID: Clifford [name], male   DOB: 12-06-1940, 73 y.o.   MRN: [id number]      SUBJECTIVE: The patient is a 73 year old male who I meeting for the first time today. He has a history of coronary artery disease with prior stenting of the LAD and RCA. He also has a history of paroxysmal atrial fibrillation and is on warfarin. He has sick sinus syndrome and has a pacemaker. He also has a history of essential hypertension and rheumatoid arthritis. He was most recently evaluated in office by Dr. Stanford Breed in the fall of 2013. ECG performed in the office today demonstrates atrial fibrillation with demand ventricular pacing. There is a nonspecific ST segment abnormality. Heart rate is 75 beats per minute.  While he denies chest pain, he's been having increasing shortness of breath and fatigue. These are symptoms similar to what he experienced prior to stent placement. He tries to remain active by gardening and cutting firewood. He had been a truck driver for 40 years. He believes his PCP follows his lipids as well as his anticoagulation monitoring. He very seldom has episodic dizziness but denies syncope, orthopnea, leg swelling and paroxysmal nocturnal dyspnea.  Device interrogation today demonstrated 62% atrial fibrillation with 29 months left of battery life. He denies palpitations.    Review of Systems: As per "subjective", otherwise negative.  Allergies  Allergen Reactions  . Demerol Nausea And Vomiting    Current Outpatient Prescriptions  Medication Sig Dispense Refill  . aspirin EC 81 MG tablet Take 81 mg by mouth daily.      . Folic Acid 20 MG CAPS Take 1 capsule by mouth daily.        Marland Kitchen glipiZIDE (GLUCOTROL) 10 MG tablet Take 10 mg by mouth daily.       . hydrochlorothiazide (HYDRODIURIL) 25 MG tablet TAKE ONE TABLET BY MOUTH ONCE EVERY DAY  30 tablet  2  . HYDROcodone-acetaminophen (VICODIN) 5-500 MG per tablet Take 1 tablet by mouth every 8 (eight) hours as needed.       . insulin  glargine (LANTUS) 100 UNIT/ML injection Inject 60 Units into the skin at bedtime.       Marland Kitchen lisinopril (PRINIVIL,ZESTRIL) 5 MG tablet Take 5 mg by mouth daily.        Marland Kitchen lovastatin (MEVACOR) 40 MG tablet Take 40 mg by mouth at bedtime.       . metFORMIN (GLUCOPHAGE) 500 MG tablet Take 1,000 mg by mouth 2 (two) times daily with a meal.        . methotrexate (RHEUMATREX) 10 MG tablet Take 40 mg by mouth once a week. Caution: Chemotherapy. Protect from light.      . metoprolol succinate (TOPROL-XL) 25 MG 24 hr tablet Take 1 tablet (25 mg total) by mouth daily.  30 tablet  6  . nitroGLYCERIN (NITROSTAT) 0.4 MG SL tablet Place 0.4 mg under the tongue every 5 (five) minutes as needed.        . warfarin (COUMADIN) 3 MG tablet Take 3 mg by mouth as directed. Per Dr. Quintin Alto office       No current facility-administered medications for this visit.    Past Medical History  Diagnosis Date  . Atrial fibrillation     Patient on amiodarone  . Unspecified essential hypertension   . Type II or unspecified type diabetes mellitus without mention of complication, not stated as uncontrolled   . Rheumatoid arthritis   . CAD (coronary artery disease)  Past Surgical History  Procedure Laterality Date  . Pacemaker insertion      History   Social History  . Marital Status: Married    Spouse Name: N/A    Number of Children: N/A  . Years of Education: N/A   Occupational History  . RETIRED    Social History Main Topics  . Smoking status: Former Smoker -- 2.00 packs/day for 50 years    Types: Cigarettes    Quit date: 05/17/1995  . Smokeless tobacco: Never Used     Comment:   Year Quit: 1997  . Alcohol Use: No  . Drug Use: No  . Sexual Activity: Not on file   Other Topics Concern  . Not on file   Social History Narrative  . No narrative on file   BP 117/74 Pulse 75  Wt 202 lbs  PHYSICAL EXAM General: NAD Neck: No JVD, no thyromegaly. Lungs: Clear to auscultation bilaterally with normal  respiratory effort. CV: Nondisplaced PMI. Irregular rhythm, normal rate, normal S1/S2, no S3, no murmur. No pretibial or periankle edema.  No carotid bruit.  Normal pedal pulses.  Abdomen: Soft, nontender, no hepatosplenomegaly, no distention.  Neurologic: Alert and oriented x 3.  Psych: Normal affect. Extremities: No clubbing or cyanosis.   ECG: reviewed and available in electronic records.      ASSESSMENT AND PLAN: 1. CAD with prior PCI of LAD and RCA: Given his symptoms of increasing dyspnea on exertion and fatigue, I will proceed with a Lexiscan Cardiolite stress test. Continue aspirin 81 mg daily and lovastatin 40 mg daily.  2. Atrial fibrillation: His rate appears to be controlled off of AV nodal blocking agents. I will continue to monitor this. Anticoagulation appears to be monitored by his PCP.  3. Essential HTN: Controlled on present therapy which includes hydrochlorothiazide 25 mg daily and lisinopril 20 mg daily.  4. Sick sinus syndrome with pacemaker: Will enroll in device clinic and reestablish with Dr. Rayann Heman. Pacemaker to be interrogated today.  5. Hyperlipidemia: Continue statin therapy. Will obtain lipids and LFT's from PCP's office.  Dispo: f/u 4-6 weeks.  Kate Sable, M.D., F.A.C.C.

## 2013-12-18 ENCOUNTER — Encounter (HOSPITAL_COMMUNITY): Admission: RE | Admit: 2013-12-18 | Payer: PRIVATE HEALTH INSURANCE | Source: Ambulatory Visit

## 2013-12-18 ENCOUNTER — Encounter (HOSPITAL_COMMUNITY)
Admission: RE | Admit: 2013-12-18 | Discharge: 2013-12-18 | Disposition: A | Discharge status: Home / Self Care | Payer: PRIVATE HEALTH INSURANCE | Source: Ambulatory Visit | Attending: Cardiovascular Disease | Admitting: Cardiovascular Disease

## 2013-12-18 ENCOUNTER — Ambulatory Visit (HOSPITAL_COMMUNITY): Admission: RE | Admit: 2013-12-18 | Payer: PRIVATE HEALTH INSURANCE | Source: Ambulatory Visit

## 2013-12-18 DIAGNOSIS — I2510 Atherosclerotic heart disease of native coronary artery without angina pectoris: Secondary | ICD-10-CM | POA: Insufficient documentation

## 2013-12-18 DIAGNOSIS — R5383 Other fatigue: Secondary | ICD-10-CM

## 2013-12-18 DIAGNOSIS — I25118 Atherosclerotic heart disease of native coronary artery with other forms of angina pectoris: Secondary | ICD-10-CM

## 2013-12-18 DIAGNOSIS — R5381 Other malaise: Secondary | ICD-10-CM | POA: Insufficient documentation

## 2013-12-18 DIAGNOSIS — I209 Angina pectoris, unspecified: Secondary | ICD-10-CM | POA: Insufficient documentation

## 2013-12-19 ENCOUNTER — Encounter (HOSPITAL_COMMUNITY)
Admission: RE | Admit: 2013-12-19 | Discharge: 2013-12-19 | Disposition: A | Discharge status: Home / Self Care | Payer: PRIVATE HEALTH INSURANCE | Source: Ambulatory Visit | Attending: Cardiovascular Disease | Admitting: Cardiovascular Disease

## 2013-12-19 ENCOUNTER — Encounter (HOSPITAL_COMMUNITY): Payer: Self-pay

## 2013-12-19 ENCOUNTER — Ambulatory Visit (HOSPITAL_COMMUNITY)
Admission: RE | Admit: 2013-12-19 | Discharge: 2013-12-19 | Disposition: A | Discharge status: Home / Self Care | Payer: PRIVATE HEALTH INSURANCE | Source: Ambulatory Visit | Attending: Cardiovascular Disease | Admitting: Cardiovascular Disease

## 2013-12-19 DIAGNOSIS — I2510 Atherosclerotic heart disease of native coronary artery without angina pectoris: Secondary | ICD-10-CM | POA: Insufficient documentation

## 2013-12-19 DIAGNOSIS — I4891 Unspecified atrial fibrillation: Secondary | ICD-10-CM | POA: Diagnosis not present

## 2013-12-19 DIAGNOSIS — R0609 Other forms of dyspnea: Secondary | ICD-10-CM | POA: Insufficient documentation

## 2013-12-19 DIAGNOSIS — R5383 Other fatigue: Principal | ICD-10-CM

## 2013-12-19 DIAGNOSIS — R0989 Other specified symptoms and signs involving the circulatory and respiratory systems: Secondary | ICD-10-CM | POA: Insufficient documentation

## 2013-12-19 DIAGNOSIS — R5381 Other malaise: Secondary | ICD-10-CM | POA: Insufficient documentation

## 2013-12-19 DIAGNOSIS — I209 Angina pectoris, unspecified: Secondary | ICD-10-CM | POA: Diagnosis present

## 2013-12-19 MED ORDER — SODIUM CHLORIDE 0.9 % IJ SOLN
10.0000 mL | INTRAMUSCULAR | Status: DC | PRN
  Administered 2013-12-19: 10 mL via INTRAVENOUS

## 2013-12-19 MED ORDER — REGADENOSON 0.4 MG/5ML IV SOLN
INTRAVENOUS | Status: AC
  Administered 2013-12-19: 0.4 mg via INTRAVENOUS
  Filled 2013-12-19: qty 5

## 2013-12-19 MED ORDER — SODIUM CHLORIDE 0.9 % IJ SOLN
INTRAMUSCULAR | Status: AC
  Administered 2013-12-19: 10 mL via INTRAVENOUS
  Filled 2013-12-19: qty 10

## 2013-12-19 MED ORDER — REGADENOSON 0.4 MG/5ML IV SOLN
0.4000 mg | Freq: Once | INTRAVENOUS | Status: AC | PRN
Start: 2013-12-19 — End: 2013-12-19
  Administered 2013-12-19: 0.4 mg via INTRAVENOUS

## 2013-12-19 MED ORDER — SODIUM CHLORIDE 0.9 % IJ SOLN
INTRAMUSCULAR | Status: AC
  Filled 2013-12-19: qty 36

## 2013-12-19 MED ORDER — TECHNETIUM TC 99M SESTAMIBI GENERIC - CARDIOLITE
30.0000 | Freq: Once | INTRAVENOUS | Status: AC | PRN
  Administered 2013-12-19: 30 via INTRAVENOUS

## 2013-12-19 MED ORDER — TECHNETIUM TC 99M SESTAMIBI - CARDIOLITE
10.0000 | Freq: Once | INTRAVENOUS | Status: AC | PRN
  Administered 2013-12-19: 10 via INTRAVENOUS

## 2013-12-19 NOTE — Progress Notes (Signed)
Stress Lab Nurses Notes - Clifford Na  [name] 12/19/2013 Reason for doing test: CAD, dyspnea, Afib Type of test: Wille Glaser Nurse performing test: Gerrit Halls, RN Nuclear Medicine Tech: Melburn Hake Echo Tech: Not Applicable MD performing test: Gaynelle Cage MD: Sasser Test explained and consent signed: Yes.   IV started: Saline lock flushed, No redness or edema and Saline lock started in radiology Symptoms: Nausea Treatment/Intervention: None Reason test stopped: protocol completed After recovery IV was: Discontinued via X-ray tech and No redness or edema Patient to return to Nuc. Med at : 10:40 Patient discharged: Home Patient's Condition upon discharge was: stable Comments: During test BP 140/73 & HR 94. Recovery BP  135/75 & HR 86.  Symptoms resolved in recovery. [name] T

## 2013-12-30 ENCOUNTER — Encounter: Payer: Self-pay | Admitting: Internal Medicine

## 2014-01-16 ENCOUNTER — Encounter: Payer: Self-pay | Admitting: Cardiovascular Disease

## 2014-01-16 ENCOUNTER — Ambulatory Visit (INDEPENDENT_AMBULATORY_CARE_PROVIDER_SITE_OTHER): Payer: PRIVATE HEALTH INSURANCE | Admitting: Cardiovascular Disease

## 2014-01-16 VITALS — BP 125/70 | HR 80 | Ht 71.0 in | Wt 209.0 lb

## 2014-01-16 DIAGNOSIS — E785 Hyperlipidemia, unspecified: Secondary | ICD-10-CM

## 2014-01-16 DIAGNOSIS — I10 Essential (primary) hypertension: Secondary | ICD-10-CM

## 2014-01-16 DIAGNOSIS — Z720 Tobacco use: Secondary | ICD-10-CM

## 2014-01-16 DIAGNOSIS — I2510 Atherosclerotic heart disease of native coronary artery without angina pectoris: Secondary | ICD-10-CM

## 2014-01-16 DIAGNOSIS — Z950 Presence of cardiac pacemaker: Secondary | ICD-10-CM

## 2014-01-16 DIAGNOSIS — E010 Iodine-deficiency related diffuse (endemic) goiter: Secondary | ICD-10-CM

## 2014-01-16 DIAGNOSIS — I4891 Unspecified atrial fibrillation: Secondary | ICD-10-CM

## 2014-01-16 DIAGNOSIS — R5381 Other malaise: Secondary | ICD-10-CM

## 2014-01-16 DIAGNOSIS — R0989 Other specified symptoms and signs involving the circulatory and respiratory systems: Secondary | ICD-10-CM

## 2014-01-16 DIAGNOSIS — F17200 Nicotine dependence, unspecified, uncomplicated: Secondary | ICD-10-CM

## 2014-01-16 DIAGNOSIS — R0609 Other forms of dyspnea: Secondary | ICD-10-CM

## 2014-01-16 DIAGNOSIS — R5383 Other fatigue: Secondary | ICD-10-CM

## 2014-01-16 DIAGNOSIS — E049 Nontoxic goiter, unspecified: Secondary | ICD-10-CM

## 2014-01-16 NOTE — Progress Notes (Signed)
Patient ID: Clifford [name], male   DOB: 11-12-1940, 73 y.o.   MRN: [id number]      SUBJECTIVE: Patient returns for followup after undergoing cardiovascular imaging performed for the evaluation of dyspnea and fatigue. Lexiscan Cardiolite stress test did not demonstrate any evidence of ischemia or scar. There was some increased uptake in the region of the upper thorax, perhaps the thyroid gland. In summary, he has a history of coronary artery disease with prior stenting of the LAD and RCA. He also has a history of paroxysmal atrial fibrillation and is on warfarin. He has sick sinus syndrome and has a pacemaker. He also has a history of essential hypertension and rheumatoid arthritis. He has a prior history of tobacco use.   Review of Systems: As per "subjective", otherwise negative.  Allergies  Allergen Reactions  . Demerol Nausea And Vomiting    Current Outpatient Prescriptions  Medication Sig Dispense Refill  . aspirin EC 81 MG tablet Take 81 mg by mouth daily.      . Folic Acid 20 MG CAPS Take 1 capsule by mouth daily.        Marland Kitchen glipiZIDE (GLUCOTROL) 10 MG tablet Take 10 mg by mouth daily.       . hydrochlorothiazide (HYDRODIURIL) 25 MG tablet TAKE ONE TABLET BY MOUTH ONCE EVERY DAY  30 tablet  2  . insulin glargine (LANTUS) 100 UNIT/ML injection Inject 60 Units into the skin at bedtime.       Marland Kitchen lisinopril (PRINIVIL,ZESTRIL) 20 MG tablet Take 20 mg by mouth daily.      Marland Kitchen lovastatin (MEVACOR) 40 MG tablet Take 40 mg by mouth at bedtime.       . metFORMIN (GLUCOPHAGE) 500 MG tablet Take 1,000 mg by mouth 2 (two) times daily with a meal.        . methotrexate (RHEUMATREX) 10 MG tablet Take 40 mg by mouth once a week. Caution: Chemotherapy. Protect from light.      . nitroGLYCERIN (NITROSTAT) 0.4 MG SL tablet Place 0.4 mg under the tongue every 5 (five) minutes as needed.        . warfarin (COUMADIN) 3 MG tablet Take 3 mg by mouth as directed. Per Dr. Quintin Alto office       No current  facility-administered medications for this visit.    Past Medical History  Diagnosis Date  . Atrial fibrillation     Patient on amiodarone  . Unspecified essential hypertension   . Type II or unspecified type diabetes mellitus without mention of complication, not stated as uncontrolled   . Rheumatoid arthritis(714.0)   . CAD (coronary artery disease)     Past Surgical History  Procedure Laterality Date  . Pacemaker insertion      History   Social History  . Marital Status: Married    Spouse Name: N/A    Number of Children: N/A  . Years of Education: N/A   Occupational History  . RETIRED    Social History Main Topics  . Smoking status: Former Smoker -- 2.00 packs/day for 50 years    Types: Cigarettes    Quit date: 05/17/1995  . Smokeless tobacco: Never Used     Comment:   Year Quit: 1997  . Alcohol Use: No  . Drug Use: No  . Sexual Activity: Not on file   Other Topics Concern  . Not on file   Social History Narrative  . No narrative on file     Filed Vitals:   01/16/14  0827  Weight: 209 lb (94.802 kg)   BP 125/70 Pulse 80   PHYSICAL EXAM General: NAD HEENT: Normal. Neck: No JVD, +thyromegaly (left side, asymmetrically enlarged). Lungs: Clear to auscultation bilaterally with normal respiratory effort. CV: Nondisplaced PMI. Irregular rhythm, normal S1/S2, no S3, no murmur. No pretibial or periankle edema.  No carotid bruit.  Normal pedal pulses.  Abdomen: Soft, nontender, no hepatosplenomegaly, no distention.  Neurologic: Alert and oriented x 3.  Psych: Normal affect. Skin: Normal. Musculoskeletal: Normal range of motion, no gross deformities. Extremities: No clubbing or cyanosis.   ECG: Most recent ECG reviewed.      ASSESSMENT AND PLAN: 1. CAD with prior PCI of LAD and RCA: Stable ischemic heart disease. Lexiscan Cardiolite stress test was normal. Continue aspirin 81 mg daily and lovastatin 40 mg daily.  2. Atrial fibrillation: His rate remains  controlled off of AV nodal blocking agents. I will continue to monitor this. Anticoagulation appears to be monitored by his PCP.  3. Essential HTN: Controlled on present therapy which includes hydrochlorothiazide 25 mg daily and lisinopril 20 mg daily.  4. Sick sinus syndrome with pacemaker: I previously enrolled him in device clinic so that he can reestablish with Dr. Rayann Heman. Pacemaker interrogated on 7/31.  5. Hyperlipidemia: Continue statin therapy. Will obtain lipids and LFT's from PCP's office.  6. Dyspnea and fatigue: Given his symptoms, I will proceed with PFT's given his history of tobacco use, as he may have underlying COPD.  7. Thyromegaly: Given his fatigue and thyroid enlargement on physical exam, with increased thyroid uptake on nuclear stress testing, I will obtain a thyroid ultrasound. Will check TSH/free T4 if not already performed by PCP recently.  Dispo: f/u 4 months.   Kate Sable, M.D., F.A.C.C.

## 2014-01-16 NOTE — Patient Instructions (Signed)
   Labs for TSH, free T4   Ultrasound of thyroid Your physician has recommended that you have a pulmonary function test. Pulmonary Function Tests are a group of tests that measure how well air moves in and out of your lungs. Office will contact with results via phone or letter.   Continue all current medications. Your physician wants you to follow up in:  4 months.  You will receive a reminder letter in the mail one-two months in advance.  If you don't receive a letter, please call our office to schedule the follow up appointment

## 2014-01-27 ENCOUNTER — Ambulatory Visit (HOSPITAL_COMMUNITY)
Admission: RE | Admit: 2014-01-27 | Discharge: 2014-01-27 | Disposition: A | Discharge status: Home / Self Care | Payer: PRIVATE HEALTH INSURANCE | Source: Ambulatory Visit | Attending: Cardiovascular Disease | Admitting: Cardiovascular Disease

## 2014-01-27 DIAGNOSIS — E042 Nontoxic multinodular goiter: Secondary | ICD-10-CM | POA: Diagnosis not present

## 2014-01-27 DIAGNOSIS — E010 Iodine-deficiency related diffuse (endemic) goiter: Secondary | ICD-10-CM

## 2014-01-27 DIAGNOSIS — E049 Nontoxic goiter, unspecified: Secondary | ICD-10-CM | POA: Diagnosis present

## 2014-01-30 ENCOUNTER — Telehealth: Payer: Self-pay | Admitting: *Deleted

## 2014-01-30 DIAGNOSIS — E042 Nontoxic multinodular goiter: Secondary | ICD-10-CM

## 2014-01-30 NOTE — Telephone Encounter (Signed)
Wife Horris Latino) notified that PFT has been scheduled for 02/10/2014 at 11:00, patient to arrive to [facility] main entrance at 10:45.  She verbalized understanding.

## 2014-01-30 NOTE — Telephone Encounter (Signed)
Message copied by Laurine Blazer on Thu Jan 30, 2014  4:12 PM ------      Message from: Kate Sable A      Created: Tue Jan 28, 2014  1:33 PM       Increase lovastatin to 80 mg and repeat lipids/LFT's in 3 months. Needs endocrinology referral (low TSH indicative of hyperthyroidism). Diabetes also needs better control. ------

## 2014-01-30 NOTE — Telephone Encounter (Signed)
Notes Recorded by Laurine Blazer, LPN on 7/98/9211 at 9:41 PM Wife Horris Latino) notified. She stated that he has his check up with Dr. Quintin Alto tomorrow morning & will discuss lab results with him. PMD has been managing his cholesterol. She will discuss with husband & will call back if patient decides to make changes above. Endocrinology referral is already being worked on.

## 2014-01-30 NOTE — Telephone Encounter (Signed)
Notes Recorded by Laurine Blazer, LPN on 4/97/5300 at 5:11 PM Patient notified of this on 01/28/2014. Order will be put in & [facility]) will be made aware. ------

## 2014-01-30 NOTE — Telephone Encounter (Signed)
Message copied by Laurine Blazer on Thu Jan 30, 2014  1:42 PM ------      Message from: Kate Sable A      Created: Mon Jan 27, 2014 10:02 AM       Pt has multinodular goiter as suspected. Would make a referral to Dr. Dorris Fetch (endocrinology) in [place]. ------

## 2014-02-08 ENCOUNTER — Inpatient Hospital Stay (HOSPITAL_COMMUNITY)
Admission: EM | Admit: 2014-02-08 | Discharge: 2014-02-10 | DRG: 066 | Disposition: A | Discharge status: Home Health | Payer: PRIVATE HEALTH INSURANCE | Attending: Internal Medicine | Admitting: Internal Medicine

## 2014-02-08 ENCOUNTER — Encounter (HOSPITAL_COMMUNITY): Payer: Self-pay | Admitting: Emergency Medicine

## 2014-02-08 ENCOUNTER — Inpatient Hospital Stay (HOSPITAL_COMMUNITY): Payer: PRIVATE HEALTH INSURANCE

## 2014-02-08 ENCOUNTER — Emergency Department (HOSPITAL_COMMUNITY): Payer: PRIVATE HEALTH INSURANCE

## 2014-02-08 DIAGNOSIS — I6350 Cerebral infarction due to unspecified occlusion or stenosis of unspecified cerebral artery: Secondary | ICD-10-CM | POA: Diagnosis not present

## 2014-02-08 DIAGNOSIS — Z794 Long term (current) use of insulin: Secondary | ICD-10-CM

## 2014-02-08 DIAGNOSIS — I10 Essential (primary) hypertension: Secondary | ICD-10-CM | POA: Diagnosis present

## 2014-02-08 DIAGNOSIS — Z87891 Personal history of nicotine dependence: Secondary | ICD-10-CM

## 2014-02-08 DIAGNOSIS — M069 Rheumatoid arthritis, unspecified: Secondary | ICD-10-CM | POA: Diagnosis present

## 2014-02-08 DIAGNOSIS — I639 Cerebral infarction, unspecified: Secondary | ICD-10-CM | POA: Diagnosis present

## 2014-02-08 DIAGNOSIS — E119 Type 2 diabetes mellitus without complications: Secondary | ICD-10-CM | POA: Diagnosis present

## 2014-02-08 DIAGNOSIS — Z7982 Long term (current) use of aspirin: Secondary | ICD-10-CM

## 2014-02-08 DIAGNOSIS — E785 Hyperlipidemia, unspecified: Secondary | ICD-10-CM | POA: Diagnosis present

## 2014-02-08 DIAGNOSIS — I2510 Atherosclerotic heart disease of native coronary artery without angina pectoris: Secondary | ICD-10-CM | POA: Diagnosis present

## 2014-02-08 DIAGNOSIS — I4891 Unspecified atrial fibrillation: Secondary | ICD-10-CM | POA: Diagnosis present

## 2014-02-08 DIAGNOSIS — Z7901 Long term (current) use of anticoagulants: Secondary | ICD-10-CM

## 2014-02-08 DIAGNOSIS — Z950 Presence of cardiac pacemaker: Secondary | ICD-10-CM | POA: Diagnosis not present

## 2014-02-08 DIAGNOSIS — R29898 Other symptoms and signs involving the musculoskeletal system: Secondary | ICD-10-CM | POA: Diagnosis not present

## 2014-02-08 HISTORY — DX: Nontoxic goiter, unspecified: E04.9

## 2014-02-08 LAB — CBC WITH DIFFERENTIAL/PLATELET
BASOS ABS: 0 10*3/uL (ref 0.0–0.1)
Basophils Relative: 0 % (ref 0–1)
EOS ABS: 0.4 10*3/uL (ref 0.0–0.7)
EOS PCT: 5 % (ref 0–5)
HEMATOCRIT: 41.6 % (ref 39.0–52.0)
Hemoglobin: 15.1 g/dL (ref 13.0–17.0)
Lymphocytes Relative: 44 % (ref 12–46)
Lymphs Abs: 3.1 10*3/uL (ref 0.7–4.0)
MCH: 30.4 pg (ref 26.0–34.0)
MCHC: 36.3 g/dL — AB (ref 30.0–36.0)
MCV: 83.9 fL (ref 78.0–100.0)
MONO ABS: 0.8 10*3/uL (ref 0.1–1.0)
Monocytes Relative: 11 % (ref 3–12)
Neutro Abs: 2.8 10*3/uL (ref 1.7–7.7)
Neutrophils Relative %: 40 % — ABNORMAL LOW (ref 43–77)
Platelets: 214 10*3/uL (ref 150–400)
RBC: 4.96 MIL/uL (ref 4.22–5.81)
RDW: 13.5 % (ref 11.5–15.5)
WBC: 7.1 10*3/uL (ref 4.0–10.5)

## 2014-02-08 LAB — BASIC METABOLIC PANEL
Anion gap: 10 (ref 5–15)
BUN: 18 mg/dL (ref 6–23)
CALCIUM: 9.8 mg/dL (ref 8.4–10.5)
CO2: 30 meq/L (ref 19–32)
CREATININE: 0.76 mg/dL (ref 0.50–1.35)
Chloride: 96 mEq/L (ref 96–112)
GFR calc Af Amer: >90 mL/min (ref >90)
GFR calc non Af Amer: 88 mL/min — ABNORMAL LOW (ref >90)
GLUCOSE: 227 mg/dL — AB (ref 70–99)
Potassium: 4 mEq/L (ref 3.7–5.3)
Sodium: 136 mEq/L — ABNORMAL LOW (ref 137–147)

## 2014-02-08 LAB — PROTIME-INR
INR: 2.86 — ABNORMAL HIGH (ref 0.00–1.49)
Prothrombin Time: 30 seconds — ABNORMAL HIGH (ref 11.6–15.2)

## 2014-02-08 MED ORDER — STROKE: EARLY STAGES OF RECOVERY BOOK
Freq: Once | Status: AC
  Administered 2014-02-10: 10:00:00
  Filled 2014-02-08: qty 1

## 2014-02-08 MED ORDER — SODIUM CHLORIDE 0.9 % IV SOLN
INTRAVENOUS | Status: DC
  Administered 2014-02-08: via INTRAVENOUS

## 2014-02-08 MED ORDER — WARFARIN SODIUM 1 MG PO TABS
1.0000 mg | ORAL_TABLET | Freq: Once | ORAL | Status: AC
  Administered 2014-02-08: 1 mg via ORAL
  Filled 2014-02-08: qty 1

## 2014-02-08 MED ORDER — ASPIRIN EC 81 MG PO TBEC
81.0000 mg | DELAYED_RELEASE_TABLET | Freq: Every day | ORAL | Status: DC
  Administered 2014-02-09 – 2014-02-10 (×2): 81 mg via ORAL
  Filled 2014-02-08 (×2): qty 1

## 2014-02-08 MED ORDER — INSULIN GLARGINE 100 UNIT/ML ~~LOC~~ SOLN
70.0000 [IU] | Freq: Every day | SUBCUTANEOUS | Status: DC
  Administered 2014-02-08 – 2014-02-10 (×3): 70 [IU] via SUBCUTANEOUS
  Filled 2014-02-08 (×3): qty 0.7

## 2014-02-08 MED ORDER — INSULIN ASPART 100 UNIT/ML ~~LOC~~ SOLN
0.0000 [IU] | Freq: Three times a day (TID) | SUBCUTANEOUS | Status: DC
  Administered 2014-02-09: 5 [IU] via SUBCUTANEOUS
  Administered 2014-02-09: 2 [IU] via SUBCUTANEOUS
  Administered 2014-02-09: 3 [IU] via SUBCUTANEOUS
  Administered 2014-02-10: 5 [IU] via SUBCUTANEOUS
  Administered 2014-02-10: 2 [IU] via SUBCUTANEOUS
  Administered 2014-02-10: 3 [IU] via SUBCUTANEOUS

## 2014-02-08 MED ORDER — SIMVASTATIN 20 MG PO TABS
40.0000 mg | ORAL_TABLET | Freq: Every day | ORAL | Status: DC
  Administered 2014-02-09 – 2014-02-10 (×2): 40 mg via ORAL
  Filled 2014-02-08 (×2): qty 2

## 2014-02-08 MED ORDER — WARFARIN - PHARMACIST DOSING INPATIENT: Freq: Every day | Status: DC

## 2014-02-08 NOTE — Progress Notes (Signed)
ANTICOAGULATION CONSULT NOTE - Initial Consult  Pharmacy Consult for Coumadin Indication: atrial fibrillation  Allergies  Allergen Reactions  . Demerol Nausea And Vomiting    Patient Measurements:   Heparin Dosing Weight:   Vital Signs: Temp: 98.4 F (36.9 C) (09/26 2102) BP: 124/84 mmHg (09/26 2130) Pulse Rate: 87 (09/26 2141)  Labs:  Recent Labs  02/08/14 1857  HGB 15.1  HCT 41.6  PLT 214  LABPROT 30.0*  INR 2.86*  CREATININE 0.76    The CrCl is unknown because both a height and weight (above a minimum accepted value) are required for this calculation.   Medical History: Past Medical History  Diagnosis Date  . Atrial fibrillation     Patient on amiodarone  . Unspecified essential hypertension   . Type II or unspecified type diabetes mellitus without mention of complication, not stated as uncontrolled   . Rheumatoid arthritis(714.0)   . CAD (coronary artery disease)   . 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 CT head is negative. MRI and MRA of brain has been ordered Coumadin PTA for AFIB to be continued INR therapeutic, however at upper range. Will give lower dose than normal to prevent INR above 3.0 Labs reviewed  Goal of Therapy:  INR 2-3 Monitor platelets by anticoagulation protocol: Yes   Plan:  Coumadin 1 mg po tonight INR/PT daily Monitor CBC, platelets  Lindey Renzulli Bennett 02/08/2014,10:17 PM

## 2014-02-08 NOTE — H&P (Signed)
PCP:   Manon Hilding, MD   Chief Complaint:  Left side weakness  HPI: 73 year old male who   has a past medical history of Atrial fibrillation; Unspecified essential hypertension; Type II or unspecified type diabetes mellitus without mention of complication, not stated as uncontrolled; Rheumatoid arthritis(714.0); CAD (coronary artery disease); and Goiter. Today presents to the ED with chief complaint of left-sided weakness which started last night and. As per patient he woke up around 3 AM last night and noticed that he had some difficulty walking and left leg but did not appear tension in back to sleep. This morning when he woke up he noticed that he had weakness in the left foot but because he had to go to a funeral he did not come to the hospital. Him on time patient also experienced weakness in the left arm. He denies slurred speech, no visual disturbance no difficulty swallowing, no confusion. Patient has a history of A. fib and is currently on anticoagulation with Coumadin. INR is therapeutic. Patient also has a pacemaker in place. In the ED, CT head was done which was negative for stroke He denies chest pain shortness of breath no nausea vomiting or diarrhea. No history of seizures. He denies numbness or tingling of the left side.  Allergies:   Allergies  Allergen Reactions  . Demerol Nausea And Vomiting      Past Medical History  Diagnosis Date  . Atrial fibrillation     Patient on amiodarone  . Unspecified essential hypertension   . Type II or unspecified type diabetes mellitus without mention of complication, not stated as uncontrolled   . Rheumatoid arthritis(714.0)   . CAD (coronary artery disease)   . Goiter     Past Surgical History  Procedure Laterality Date  . Pacemaker insertion      Prior to Admission medications   Medication Sig Start Date End Date Taking? Authorizing Provider  aspirin EC 81 MG tablet Take 81 mg by mouth daily.   Yes Historical Provider,  MD  FOLIC ACID PO Take 1 tablet by mouth daily.   Yes Historical Provider, MD  glipiZIDE (GLUCOTROL) 10 MG tablet Take 10 mg by mouth daily.    Yes Historical Provider, MD  hydrochlorothiazide (HYDRODIURIL) 25 MG tablet Take 25 mg by mouth daily.   Yes Historical Provider, MD  HYDROcodone-acetaminophen (NORCO/VICODIN) 5-325 MG per tablet Take 1 tablet by mouth as needed for moderate pain or severe pain.    Yes Historical Provider, MD  insulin glargine (LANTUS) 100 UNIT/ML injection Inject 70 Units into the skin at bedtime.    Yes Historical Provider, MD  lisinopril (PRINIVIL,ZESTRIL) 20 MG tablet Take 20 mg by mouth daily.   Yes Historical Provider, MD  lovastatin (MEVACOR) 40 MG tablet Take 80 mg by mouth at bedtime.    Yes Historical Provider, MD  metFORMIN (GLUCOPHAGE) 500 MG tablet Take 1,000 mg by mouth 2 (two) times daily with a meal.    Yes Historical Provider, MD  methotrexate (RHEUMATREX) 2.5 MG tablet Take 10 mg by mouth once a week. On Mondays- Caution:Chemotherapy. Protect from light.   Yes Historical Provider, MD  Ranitidine HCl (ACID REDUCER PO) Take 1 tablet by mouth at bedtime as needed (Acid Reflux).   Yes Historical Provider, MD  warfarin (COUMADIN) 3 MG tablet Take 6 mg by mouth daily.    Yes Historical Provider, MD  nitroGLYCERIN (NITROSTAT) 0.4 MG SL tablet Place 0.4 mg under the tongue every 5 (five) minutes as needed  for chest pain.     Historical Provider, MD    Social History:  reports that he quit smoking about 18 years ago. His smoking use included Cigarettes. He started smoking about 69 years ago. He has a 100 pack-year smoking history. He has never used smokeless tobacco. He reports that he does not drink alcohol or use illicit drugs.  History reviewed. No pertinent family history.   All the positives are listed in BOLD  Review of Systems:  HEENT: Headache, blurred vision, runny nose, sore throat Neck: Hypothyroidism, hyperthyroidism,,lymphadenopathy Chest :  Shortness of breath, history of COPD, Asthma Heart : Chest pain, history of coronary arterey disease, status post pacemaker placement GI:  Nausea, vomiting, diarrhea, constipation, GERD GU: Dysuria, urgency, frequency of urination, hematuria Neuro: Stroke, seizures, syncope Psych: Depression, anxiety, hallucinations   Physical Exam: Blood pressure 129/69, pulse 74, temperature 98.4 F (36.9 C), resp. rate 20, SpO2 97.00%. Constitutional:   Patient is a well-developed and well-nourished male* in no acute distress and cooperative with exam. Head: Normocephalic and atraumatic Mouth: Mucus membranes moist Eyes: PERRL, EOMI, conjunctivae normal Neck: Supple, No Thyromegaly Cardiovascular: RRR, S1 normal, S2 normal Pulmonary/Chest: CTAB, no wheezes, rales, or rhonchi Abdominal: Soft. Non-tender, non-distended, bowel sounds are normal, no masses, organomegaly, or guarding present.  Neurological: A&O x3, Strenght is normal and symmetric bilaterally, cranial nerve II-XII are grossly intact, motor strength 3 x 5 in both left upper and lower left extremity, sensations are reduced in left arm and leg. Reflexes are 2+ bilaterally  Extremities : No Cyanosis, Clubbing or Edema  Labs on Admission:  Basic Metabolic Panel:  Recent Labs Lab 02/08/14 1857  NA 136*  K 4.0  CL 96  CO2 30  GLUCOSE 227*  BUN 18  CREATININE 0.76  CALCIUM 9.8   CBC:  Recent Labs Lab 02/08/14 1857  WBC 7.1  NEUTROABS 2.8  HGB 15.1  HCT 41.6  MCV 83.9  PLT 214    Radiological Exams on Admission: Ct Head Wo Contrast  02/08/2014   CLINICAL DATA:  Left-sided weakness.  EXAM: CT HEAD WITHOUT CONTRAST  TECHNIQUE: Contiguous axial images were obtained from the base of the skull through the vertex without intravenous contrast.  COMPARISON:  CT scan of December 05, 2010.  FINDINGS: Bony calvarium appears intact. Mild diffuse cortical atrophy is noted. Mild chronic ischemic white matter disease is noted. No mass effect or  midline shift is noted. Ventricular size is within normal limits. There is no evidence of mass lesion, hemorrhage or acute infarction.  IMPRESSION: Mild diffuse cortical atrophy. Mild chronic ischemic white matter disease. No acute intracranial abnormality seen.   Electronically Signed   By: Sabino Dick M.D.   On: 02/08/2014 20:02    EKG: Independently reviewed. Atrial fibrillation with controlled rate   Assessment/Plan Active Problems:   Atrial fibrillation   Chronic anticoagulation   CVA (cerebral infarction)   Diabetes mellitus  CVA Patient is presenting with left upper and left lower extremity weakness consistent with CVA . CT head is negative. Will admit the patient to the hospital and get carotid ultrasound, 2-D echo, MRI and MRA of the brain has been ordered. Will continue his aspirin and Coumadin INR is therapeutic Will check hemoglobin A1c and fasting lipid profile. We'll hold the antihypertensive regimen for permissive hypertension  Atrial fibrillation Heart rate is controlled Continue Coumadin  Diabetes mellitus Will continue Lantus and initiate sliding scale insulin with NovoLog.  Code status: Patient is full code  Family discussion:  Admission, patients condition and plan of care including tests being ordered have been discussed with the patient and his wife at bedside* who indicate understanding and agree with the plan and Code Status.   Time Spent on Admission: 60 minutes  [facility] Pager: [phone] 02/08/2014, 9:18 PM  If 7PM-7AM, please contact night-coverage  [email or web address]  Password [email or web address]

## 2014-02-08 NOTE — ED Provider Notes (Signed)
CSN: [id number]     Arrival date & time 02/08/14  1844 History   First MD Initiated Contact with Patient 02/08/14 1859    This chart was scribed for Kathie Dike, MD by Terressa Koyanagi, ED Scribe. This patient was seen in room A322/A322-01 and the patient's care was started at 7:04 PM.  Chief Complaint  Patient presents with  . Cerebrovascular Accident   The history is provided by the patient. No language interpreter was used.   PCP: Manon Hilding, MD HPI Comments: Clifford [name] is a 73 y.o. male, with medical Hx noted below and significant for pacemaker insertion, Afib, unspecified HTN, DMTII, CAD, rheumatoid arthritis, who presents to the Emergency Department complaining of worsening weakness of the left foot radiating to the left leg and left arm onset this morning around 3AM. Pt reports that he felt at baseline when he went to bed last night, however, at 3AM this morning he woke up to use the restroom and his left foot felt weak. Pt thereafter went back to sleep and woke up again at 4AM this morning and again felt weakness in his left foot. Pt woke up at 7AM this morning and went to a funeral thereafter pt's weakness of the left foot radiated to his left leg and pt was unable to walk. Around noon today pt began to experience weakness in his left arm. Pt denies feeling confused or any disturbances in his thought process. Pt is currently on coumadin and his coumadin level was 2.4 yesterday.  Past Medical History  Diagnosis Date  . Atrial fibrillation     Patient on amiodarone  . Unspecified essential hypertension   . Type II or unspecified type diabetes mellitus without mention of complication, not stated as uncontrolled   . Rheumatoid arthritis(714.0)   . CAD (coronary artery disease)   . Goiter    Past Surgical History  Procedure Laterality Date  . Pacemaker insertion     History reviewed. No pertinent family history. History  Substance Use Topics  . Smoking status: Former Smoker --  2.00 packs/day for 50 years    Types: Cigarettes    Start date: 01/16/1945    Quit date: 05/17/1995  . Smokeless tobacco: Never Used     Comment: Patient states he has been smoking since he was 4  . Alcohol Use: No    Review of Systems  A complete 10 system review of systems was obtained and all systems are negative except as noted in the HPI and PMH.   Allergies  Demerol  Home Medications   Prior to Admission medications   Medication Sig Start Date End Date Taking? Authorizing Provider  aspirin EC 81 MG tablet Take 81 mg by mouth daily.   Yes Historical Provider, MD  FOLIC ACID PO Take 1 tablet by mouth daily.   Yes Historical Provider, MD  glipiZIDE (GLUCOTROL) 10 MG tablet Take 10 mg by mouth daily.    Yes Historical Provider, MD  hydrochlorothiazide (HYDRODIURIL) 25 MG tablet Take 25 mg by mouth daily.   Yes Historical Provider, MD  HYDROcodone-acetaminophen (NORCO/VICODIN) 5-325 MG per tablet Take 1 tablet by mouth as needed for moderate pain or severe pain.    Yes Historical Provider, MD  insulin glargine (LANTUS) 100 UNIT/ML injection Inject 70 Units into the skin at bedtime.    Yes Historical Provider, MD  lisinopril (PRINIVIL,ZESTRIL) 20 MG tablet Take 20 mg by mouth daily.   Yes Historical Provider, MD  lovastatin (MEVACOR) 40 MG tablet  Take 80 mg by mouth at bedtime.    Yes Historical Provider, MD  metFORMIN (GLUCOPHAGE) 500 MG tablet Take 1,000 mg by mouth 2 (two) times daily with a meal.    Yes Historical Provider, MD  methotrexate (RHEUMATREX) 2.5 MG tablet Take 10 mg by mouth once a week. On Mondays- Caution:Chemotherapy. Protect from light.   Yes Historical Provider, MD  Ranitidine HCl (ACID REDUCER PO) Take 1 tablet by mouth at bedtime as needed (Acid Reflux).   Yes Historical Provider, MD  warfarin (COUMADIN) 3 MG tablet Take 6 mg by mouth daily.    Yes Historical Provider, MD  nitroGLYCERIN (NITROSTAT) 0.4 MG SL tablet Place 0.4 mg under the tongue every 5 (five)  minutes as needed for chest pain.     Historical Provider, MD   Triage Vitals: BP 131/94  Pulse 81  Resp 12  SpO2 96% Physical Exam  Nursing note and vitals reviewed. Constitutional: He is oriented to person, place, and time. He appears well-developed and well-nourished. No distress.  HENT:  Head: Normocephalic and atraumatic.  Cardiovascular: Normal rate.   Pulmonary/Chest: Effort normal. No respiratory distress.  Neurological: He is alert and oriented to person, place, and time.  Motor strength 2/5 on left. 5/5 on right.   Skin: Skin is warm and dry.  Psychiatric: He has a normal mood and affect. His behavior is normal.    ED Course  Procedures (including critical care time) DIAGNOSTIC STUDIES: Oxygen Saturation is 96% on RA, adequate by my interpretation.    COORDINATION OF CARE: 7:14 PM-Discussed treatment plan which includes imaging, EKG, check coumadin level, and likely admission to the hospital with pt at bedside and pt agreed to plan.   Labs Review Labs Reviewed  BASIC METABOLIC PANEL - Abnormal; Notable for the following:    Sodium 136 (*)    Glucose, Bld 227 (*)    GFR calc non Af Amer 88 (*)    All other components within normal limits  CBC WITH DIFFERENTIAL - Abnormal; Notable for the following:    MCHC 36.3 (*)    Neutrophils Relative % 40 (*)    All other components within normal limits  PROTIME-INR - Abnormal; Notable for the following:    Prothrombin Time 30.0 (*)    INR 2.86 (*)    All other components within normal limits  PROTIME-INR - Abnormal; Notable for the following:    Prothrombin Time 29.0 (*)    INR 2.74 (*)    All other components within normal limits  LIPID PANEL - Abnormal; Notable for the following:    HDL 38 (*)    All other components within normal limits  GLUCOSE, CAPILLARY - Abnormal; Notable for the following:    Glucose-Capillary 201 (*)    All other components within normal limits  GLUCOSE, CAPILLARY - Abnormal; Notable for the  following:    Glucose-Capillary 256 (*)    All other components within normal limits  CBC  HEMOGLOBIN A1C    Imaging Review Dg Chest 2 View  02/09/2014   CLINICAL DATA:  Left-sided weakness, possible CVA  EXAM: CHEST  2 VIEW  COMPARISON:  None.  FINDINGS: There is no focal parenchymal opacity, pleural effusion, or pneumothorax. The heart and mediastinal contours are unremarkable. There is a dual lead AICD.  The osseous structures are unremarkable.  IMPRESSION: No active cardiopulmonary disease.   Electronically Signed   By: Kathreen Devoid   On: 02/09/2014 00:02   Ct Head Wo Contrast  02/08/2014   CLINICAL DATA:  Left-sided weakness.  EXAM: CT HEAD WITHOUT CONTRAST  TECHNIQUE: Contiguous axial images were obtained from the base of the skull through the vertex without intravenous contrast.  COMPARISON:  CT scan of December 05, 2010.  FINDINGS: Bony calvarium appears intact. Mild diffuse cortical atrophy is noted. Mild chronic ischemic white matter disease is noted. No mass effect or midline shift is noted. Ventricular size is within normal limits. There is no evidence of mass lesion, hemorrhage or acute infarction.  IMPRESSION: Mild diffuse cortical atrophy. Mild chronic ischemic white matter disease. No acute intracranial abnormality seen.   Electronically Signed   By: Sabino Dick M.D.   On: 02/08/2014 20:02     EKG Interpretation   Date/Time:  Saturday February 08 2014 18:58:56 EDT Ventricular Rate:  80 PR Interval:    QRS Duration: 98 QT Interval:  515 QTC Calculation: 594 R Axis:   83 Text Interpretation:  Atrial fibrillation Borderline right axis deviation  Borderline T abnormalities, inferior leads Prolonged QT interval Confirmed  by Lacinda Axon  MD, Rosalee Tolley ([id number]) on 02/08/2014 7:37:01 PM     CRITICAL CARE Performed by: Nat Christen  ?  Total critical care time: 30  Critical care time was exclusive of separately billable procedures and treating other patients.  Critical care was  necessary to treat or prevent imminent or life-threatening deterioration.  Critical care was time spent personally by me on the following activities: development of treatment plan with patient and/or surrogate as well as nursing, discussions with consultants, evaluation of patient's response to treatment, examination of patient, obtaining history from patient or surrogate, ordering and performing treatments and interventions, ordering and review of laboratory studies, ordering and review of radiographic studies, pulse oximetry and re-evaluation of patient's condition. MDM   Final diagnoses:  Cerebral infarction due to unspecified mechanism  Chronic anticoagulation  Essential hypertension, benign  Type II or unspecified type diabetes mellitus without mention of complication, not stated as uncontrolled    History and physical consistent with left-sided stroke. Patient is lucid and oriented x3. CT scan shows no acute anomalies. Patient has multiple cardiovascular risk factors. He does not fit criteria for acute thrombolytic therapy  I personally performed the services described in this documentation, which was scribed in my presence. The recorded information has been reviewed and is accurate.    Nat Christen, MD 02/09/14 [phone]

## 2014-02-08 NOTE — ED Notes (Signed)
Pt states pt went to bed at midnight and was normal. Woke up this morning with slight difficulty walking this morning, went back to bed and woke up again with same, unable to use left leg, weaker grip to left hand than right, pt denies any pain, no facial droop noted

## 2014-02-09 ENCOUNTER — Inpatient Hospital Stay (HOSPITAL_COMMUNITY): Payer: PRIVATE HEALTH INSURANCE

## 2014-02-09 DIAGNOSIS — I4891 Unspecified atrial fibrillation: Secondary | ICD-10-CM

## 2014-02-09 DIAGNOSIS — Z950 Presence of cardiac pacemaker: Secondary | ICD-10-CM

## 2014-02-09 LAB — LIPID PANEL
Cholesterol: 153 mg/dL (ref 0–200)
HDL: 38 mg/dL — ABNORMAL LOW (ref >39)
LDL Cholesterol: 87 mg/dL (ref 0–99)
Total CHOL/HDL Ratio: 4 RATIO
Triglycerides: 139 mg/dL (ref <150)
VLDL: 28 mg/dL (ref 0–40)

## 2014-02-09 LAB — GLUCOSE, CAPILLARY
Glucose-Capillary: 201 mg/dL — ABNORMAL HIGH (ref 70–99)
Glucose-Capillary: 256 mg/dL — ABNORMAL HIGH (ref 70–99)
Glucose-Capillary: 174 mg/dL — ABNORMAL HIGH (ref 70–99)
Glucose-Capillary: 280 mg/dL — ABNORMAL HIGH (ref 70–99)

## 2014-02-09 LAB — CBC
HCT: 40 % (ref 39.0–52.0)
Hemoglobin: 14.4 g/dL (ref 13.0–17.0)
MCH: 30.1 pg (ref 26.0–34.0)
MCHC: 36 g/dL (ref 30.0–36.0)
MCV: 83.7 fL (ref 78.0–100.0)
Platelets: 184 K/uL (ref 150–400)
RBC: 4.78 MIL/uL (ref 4.22–5.81)
RDW: 13.4 % (ref 11.5–15.5)
WBC: 5.5 K/uL (ref 4.0–10.5)

## 2014-02-09 LAB — PROTIME-INR
INR: 2.74 — ABNORMAL HIGH (ref 0.00–1.49)
Prothrombin Time: 29 s — ABNORMAL HIGH (ref 11.6–15.2)

## 2014-02-09 MED ORDER — WARFARIN - PHARMACIST DOSING INPATIENT
Status: DC
Start: 2014-02-09 — End: 2014-02-10

## 2014-02-09 MED ORDER — WARFARIN SODIUM 5 MG PO TABS
6.0000 mg | ORAL_TABLET | Freq: Once | ORAL | Status: AC
  Administered 2014-02-09: 6 mg via ORAL
  Filled 2014-02-09 (×2): qty 1

## 2014-02-09 NOTE — Progress Notes (Signed)
Pt. Has pacemaker and was scheduled for an MRI and MRA.  Radiology called to confirm that patient has pacemaker is canceling order.  Notified MD.  Will continue to monitor patient.

## 2014-02-09 NOTE — Progress Notes (Signed)
  Echocardiogram 2D Echocardiogram has been performed.  Samuel Germany 02/09/2014, 11:12 AM

## 2014-02-09 NOTE — Progress Notes (Addendum)
[facility] PROGRESS NOTE  Clifford [name] ZJQ:[id number] DOB: [birth year] DOA: 02/08/2014 PCP: Clifford Hilding, MD  Assessment/Plan: 1. Acute CVA. Patient is noted with acute onset of left upper and lower extremity weakness. He was not felt a candidate for TPA since he woke up with his symptoms and then delayed coming to the hospital. He is also anticoagulated on Coumadin. He has undergone a 2-D echocardiogram today. Will followup carotid Dopplers. Since he has a pacemaker in place, he is not a candidate for MRI. We'll repeat CT in the morning to check for any evolving strokes. Neurology has been consulted, await input. He is Anticoagulated  with Coumadin and takes aspirin as well. He likely does not need any further antiplatelet therapy. Patient declined any physical therapy evaluations today. This will be reassessed tomorrow. 2.  atrial  fibrillation. Rate controlled. Anticoagulated with Coumadin. INR is therapeutic 3. Sick sinus syndrome status post pacemaker. Follows with Dr. Rayann [name]. 4. Thyromegaly with low TSH. These studies have been performed as an outpatient. Plans are for her endocrinology followup for further workup/management. 5. Hyperlipidemia. Continue statin therapy. LDL is less than 100. 6. Diabetes. A1c is above goal. This will need to be further adjusted in the outpatient setting.  7. hypertension. Appears reasonably controlled. 8. Chronic dyspnea fatigue. Plans are for outpatient PFTs.  Code Status: Full code Family Communication: Discussed with patient and family at bedside Disposition Plan: Pending PT evaluation, suspect he will return home.   Consultants:  Neurology  Procedures: Echo: - Left ventricle: The cavity size was normal. Wall thickness was increased in a pattern of mild LVH. Systolic function was normal. The estimated ejection fraction was in the range of 60% to 65%. - Aortic valve: There was mild regurgitation. - Left atrium: The atrium was moderately to  severely dilated.    Antibiotics:    HPI/Subjective: No new complaints. Feels weak on his right side, but feels that it may be better than yesterday  Objective: Filed Vitals:   02/09/14 0532  BP: 124/50  Pulse: 67  Temp: 97.5 F (36.4 C)  Resp: 20    Intake/Output Summary (Last 24 hours) at 02/09/14 1633 Last data filed at 02/09/14 1200  Gross per 24 hour  Intake    770 ml  Output    750 ml  Net     20 ml   Filed Weights   02/08/14 2236 02/09/14 0532  Weight: 90 kg (198 lb 6.6 oz) 91 kg (200 lb 9.9 oz)    Exam:   General:  NAD  Cardiovascular: s1, s2, irregular  Respiratory: cta b  Abdomen: Soft, nontender, positive bowel sounds  Musculoskeletal: Significant left-sided left-sided weakness in the upper and lower extremities. 5 out of 5 strength in the right upper lower extremities   Data Reviewed: Basic Metabolic Panel:  Recent Labs Lab 02/08/14 1857  NA 136*  K 4.0  CL 96  CO2 30  GLUCOSE 227*  BUN 18  CREATININE 0.76  CALCIUM 9.8   Liver Function Tests: No results found for this basename: AST, ALT, ALKPHOS, BILITOT, PROT, ALBUMIN,  in the last 168 hours No results found for this basename: LIPASE, AMYLASE,  in the last 168 hours No results found for this basename: AMMONIA,  in the last 168 hours CBC:  Recent Labs Lab 02/08/14 1857 02/09/14 0556  WBC 7.1 5.5  NEUTROABS 2.8  --   HGB 15.1 14.4  HCT 41.6 40.0  MCV 83.9 83.7  PLT 214 184   Cardiac  Enzymes: No results found for this basename: CKTOTAL, CKMB, CKMBINDEX, TROPONINI,  in the last 168 hours BNP (last 3 results) No results found for this basename: PROBNP,  in the last 8760 hours CBG:  Recent Labs Lab 02/09/14 0749 02/09/14 1153  GLUCAP 201* 256*    No results found for this or any previous visit (from the past 240 hour(s)).   Studies: Dg Chest 2 View  02/09/2014   CLINICAL DATA:  Left-sided weakness, possible CVA  EXAM: CHEST  2 VIEW  COMPARISON:  None.  FINDINGS:  There is no focal parenchymal opacity, pleural effusion, or pneumothorax. The heart and mediastinal contours are unremarkable. There is a dual lead AICD.  The osseous structures are unremarkable.  IMPRESSION: No active cardiopulmonary disease.   Electronically Signed   By: Kathreen Devoid   On: 02/09/2014 00:02   Ct Head Wo Contrast  02/08/2014   CLINICAL DATA:  Left-sided weakness.  EXAM: CT HEAD WITHOUT CONTRAST  TECHNIQUE: Contiguous axial images were obtained from the base of the skull through the vertex without intravenous contrast.  COMPARISON:  CT scan of December 05, 2010.  FINDINGS: Bony calvarium appears intact. Mild diffuse cortical atrophy is noted. Mild chronic ischemic white matter disease is noted. No mass effect or midline shift is noted. Ventricular size is within normal limits. There is no evidence of mass lesion, hemorrhage or acute infarction.  IMPRESSION: Mild diffuse cortical atrophy. Mild chronic ischemic white matter disease. No acute intracranial abnormality seen.   Electronically Signed   By: Sabino Dick M.D.   On: 02/08/2014 20:02    Scheduled Meds: .  stroke: mapping our early stages of recovery book   Does not apply Once  . aspirin EC  81 mg Oral Daily  . insulin aspart  0-9 Units Subcutaneous TID WC  . insulin glargine  70 Units Subcutaneous QHS  . simvastatin  40 mg Oral q1800  . warfarin  6 mg Oral Once  . Warfarin - Pharmacist Dosing Inpatient   Does not apply Q24H   Continuous Infusions: . sodium chloride 50 mL/hr at 02/08/14 2353    Active Problems:   Atrial fibrillation   Chronic anticoagulation   CVA (cerebral infarction)   Diabetes mellitus    Time spent: 84mins    [name],Clifford  [facility] Pager [phone]. If 7PM-7AM, please contact night-coverage at [email or web address], password [facility] 02/09/2014, 4:33 PM  LOS: 1 day

## 2014-02-09 NOTE — Progress Notes (Signed)
An attempt was made to see pt for a PT evaluation.  He was sleeping at my arrival and became a bit upset when I awoke him.  He stated that he had not been able to get any sleep and was very tired.  He also stated that he generally did not feel well.  He declined to work with me today but agreed to see me tomorrow.

## 2014-02-09 NOTE — Progress Notes (Signed)
ANTICOAGULATION CONSULT NOTE - Pharmacy Consult for Coumadin Indication: atrial fibrillation  Allergies  Allergen Reactions  . Demerol Nausea And Vomiting    Patient Measurements: Height: 5\' 11"  (180.3 cm) Weight: 200 lb 9.9 oz (91 kg) IBW/kg (Calculated) : 75.3 Heparin Dosing Weight:   Vital Signs: Temp: 97.5 F (36.4 C) (09/27 0532) Temp src: Oral (09/27 0532) BP: 124/50 mmHg (09/27 0532) Pulse Rate: 67 (09/27 0532)  Labs:  Recent Labs  02/08/14 1857 02/09/14 0556  HGB 15.1 14.4  HCT 41.6 40.0  PLT 214 184  LABPROT 30.0* 29.0*  INR 2.86* 2.74*  CREATININE 0.76  --     Estimated Creatinine Clearance: 94.9 ml/min (by C-G formula based on Cr of 0.76).   Medical History: Past Medical History  Diagnosis Date  . Atrial fibrillation     Patient on amiodarone  . Unspecified essential hypertension   . Type II or unspecified type diabetes mellitus without mention of complication, not stated as uncontrolled   . Rheumatoid arthritis(714.0)   . CAD (coronary artery disease)   . 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 CT head is negative. MRI and MRA of brain has been ordered Coumadin PTA for AFIB to be continued INR therapeutic Labs reviewed, no bleeding noted  Goal of Therapy:  INR 2-3 Monitor platelets by anticoagulation protocol: Yes   Plan:  Coumadin 6 mg po tonight (home regiment) INR/PT daily Monitor CBC, platelets  Talyah Seder Bennett 02/09/2014,9:41 AM

## 2014-02-10 ENCOUNTER — Encounter (HOSPITAL_COMMUNITY): Payer: PRIVATE HEALTH INSURANCE

## 2014-02-10 ENCOUNTER — Inpatient Hospital Stay (HOSPITAL_COMMUNITY): Payer: PRIVATE HEALTH INSURANCE

## 2014-02-10 DIAGNOSIS — E785 Hyperlipidemia, unspecified: Secondary | ICD-10-CM

## 2014-02-10 LAB — URINALYSIS, ROUTINE W REFLEX MICROSCOPIC
Bilirubin Urine: NEGATIVE
Glucose, UA: 500 mg/dL — AB
Hgb urine dipstick: NEGATIVE
Ketones, ur: NEGATIVE mg/dL
Leukocytes, UA: NEGATIVE
Nitrite: NEGATIVE
Protein, ur: NEGATIVE mg/dL
SPECIFIC GRAVITY, URINE: 1.01 (ref 1.005–1.030)
UROBILINOGEN UA: 0.2 mg/dL (ref 0.0–1.0)
pH: 6.5 (ref 5.0–8.0)

## 2014-02-10 LAB — HEMOGLOBIN A1C
Hgb A1c MFr Bld: 9.1 % — ABNORMAL HIGH (ref <5.7)
MEAN PLASMA GLUCOSE: 214 mg/dL — AB (ref <117)

## 2014-02-10 LAB — GLUCOSE, CAPILLARY
GLUCOSE-CAPILLARY: 212 mg/dL — AB (ref 70–99)
GLUCOSE-CAPILLARY: 192 mg/dL — AB (ref 70–99)
Glucose-Capillary: 270 mg/dL — ABNORMAL HIGH (ref 70–99)

## 2014-02-10 LAB — CBC
HCT: 40.5 % (ref 39.0–52.0)
Hemoglobin: 14.4 g/dL (ref 13.0–17.0)
MCH: 29.9 pg (ref 26.0–34.0)
MCHC: 35.6 g/dL (ref 30.0–36.0)
MCV: 84.2 fL (ref 78.0–100.0)
PLATELETS: 166 10*3/uL (ref 150–400)
RBC: 4.81 MIL/uL (ref 4.22–5.81)
RDW: 13.5 % (ref 11.5–15.5)
WBC: 5.3 10*3/uL (ref 4.0–10.5)

## 2014-02-10 LAB — PROTIME-INR
INR: 2.46 — ABNORMAL HIGH (ref 0.00–1.49)
Prothrombin Time: 26.7 seconds — ABNORMAL HIGH (ref 11.6–15.2)

## 2014-02-10 MED ORDER — WARFARIN SODIUM 5 MG PO TABS
6.0000 mg | ORAL_TABLET | Freq: Once | ORAL | Status: AC
  Administered 2014-02-10: 6 mg via ORAL
  Filled 2014-02-10 (×2): qty 1

## 2014-02-10 MED ORDER — APIXABAN 5 MG PO TABS: 5.0000 mg | ORAL_TABLET | Freq: Two times a day (BID) | ORAL | Status: DC

## 2014-02-10 NOTE — Progress Notes (Signed)
[facility] for Coumadin Indication: atrial fibrillation  Allergies  Allergen Reactions  . Demerol Nausea And Vomiting   Patient Measurements: Height: 5\' 11"  (180.3 cm) Weight: 201 lb 15.1 oz (91.6 kg) IBW/kg (Calculated) : 75.3  Vital Signs: Temp: 97.7 F (36.5 C) (09/28 0557) Temp src: Oral (09/28 0557) BP: 136/66 mmHg (09/28 0557) Pulse Rate: 79 (09/28 0557)  Labs:  Recent Labs  02/08/14 1857 02/09/14 0556 02/10/14 0603  HGB 15.1 14.4 14.4  HCT 41.6 40.0 40.5  PLT 214 184 166  LABPROT 30.0* 29.0* 26.7*  INR 2.86* 2.74* 2.46*  CREATININE 0.76  --   --    Estimated Creatinine Clearance: 95.1 ml/min (by C-G formula based on Cr of 0.76).  Medical History: Past Medical History  Diagnosis Date  . Atrial fibrillation     Patient on amiodarone  . Unspecified essential hypertension   . Type II or unspecified type diabetes mellitus without mention of complication, not stated as uncontrolled   . Rheumatoid arthritis(714.0)   . CAD (coronary artery disease)   . Goiter    Medications:  Medications Prior to Admission  Medication Sig Dispense Refill  . aspirin EC 81 MG tablet Take 81 mg by mouth daily.      Marland Kitchen FOLIC ACID PO Take 1 tablet by mouth daily.      Marland Kitchen glipiZIDE (GLUCOTROL) 10 MG tablet Take 10 mg by mouth daily.       . hydrochlorothiazide (HYDRODIURIL) 25 MG tablet Take 25 mg by mouth daily.      Marland Kitchen HYDROcodone-acetaminophen (NORCO/VICODIN) 5-325 MG per tablet Take 1 tablet by mouth as needed for moderate pain or severe pain.       Marland Kitchen insulin glargine (LANTUS) 100 UNIT/ML injection Inject 70 Units into the skin at bedtime.       Marland Kitchen lisinopril (PRINIVIL,ZESTRIL) 20 MG tablet Take 20 mg by mouth daily.      Marland Kitchen lovastatin (MEVACOR) 40 MG tablet Take 80 mg by mouth at bedtime.       . metFORMIN (GLUCOPHAGE) 500 MG tablet Take 1,000 mg by mouth 2 (two) times daily with a meal.       . methotrexate (RHEUMATREX) 2.5 MG tablet Take 10 mg by  mouth once a week. On Mondays- Caution:Chemotherapy. Protect from light.      . Ranitidine HCl (ACID REDUCER PO) Take 1 tablet by mouth at bedtime as needed (Acid Reflux).      . warfarin (COUMADIN) 3 MG tablet Take 6 mg by mouth daily.       . nitroGLYCERIN (NITROSTAT) 0.4 MG SL tablet Place 0.4 mg under the tongue every 5 (five) minutes as needed for chest pain.        Assessment: Acute CVA. CT head is negative. Coumadin PTA for AFIB to be continued. INR therapeutic Labs reviewed, no bleeding noted  Goal of Therapy:  INR 2-3  Plan:  Repeat Coumadin 6 mg po x 1. INR/PT daily  Pricilla Larsson 02/10/2014,10:36 AM

## 2014-02-10 NOTE — Progress Notes (Signed)
Brief Nutrition Note  Attempted to see pt for consult for diet education x 3, however, pt unavailable at all attempts. Will follow-up at a later date.   Kenyen Candy A. Jimmye Norman, RD, LDN Pager: [phone]

## 2014-02-10 NOTE — Progress Notes (Addendum)
Inpatient Diabetes Program Recommendations  AACE/ADA: New Consensus Statement on Inpatient Glycemic Control (2013)  Target Ranges:  Prepandial:   less than 140 mg/dL      Peak postprandial:   less than 180 mg/dL (1-2 hours)      Critically ill patients:  140 - 180 mg/dL   Results for Clifford [name], Clifford [name] (MRN [id number]) as of 02/10/2014 08:33  Ref. Range 02/09/2014 05:56  Hemoglobin A1C Latest Range: <5.7 % 9.1 (H)   Results for Clifford, [name] (MRN [id number]) as of 02/10/2014 08:33  Ref. Range 02/09/2014 07:49 02/09/2014 11:53 02/09/2014 16:54 02/09/2014 21:13 02/10/2014 07:29  Glucose-Capillary Latest Range: 70-99 mg/dL 201 (H) 256 (H) 174 (H) 280 (H) 212 (H)   Diabetes history: DM2 Outpatient Diabetes medications: Lantus 70 units QHS, Metformin 1000 mg BID, Glipizide 10 mg daily Current orders for Inpatient glycemic control: Lantus 70 units QHs, Novolog 0-9 units AC  Inpatient Diabetes Program Recommendations Insulin - Basal: Please consider increasing Lantus to 73 units QHS. Correction (SSI): Please consider increasing Novolog correction to moderate scale and add Novolog bedtime correction scale. HgbA1C: Noted A1C was 9.1% on 02/09/14.  02/10/14@14 :75- Spoke with patient and his wife about diabetes and home regimen for diabetes control. Patient reports that he is followed by his PCP (Clifford [name]) for diabetes management and currently he takes Lantus 70 units QHS, Metformin 1000 mg BID, and Glipizide 10 mg daily as an outpatient for diabetes control. In talking with the patient and his wife, patient had stopped and/or decreased the Lantus for about the last month or two (wife states he stopped taking it but patient reports that he was taking 50 units trying to make due with what he had). The reason for the change was cost; patient is in the "doughnut hole" and was expected to pay over $500 for 2 vials of Lantus. Patient reports that he discussed with Clifford [name] at his last visit about a month ago and Dr.  Quintin [name] provided him with samples of the Lantus and asked that he check with [facility] and see what insulins they had that were cheaper. Patient reports he was told [facility] had 3 different insulins he could get for about $25 per vial (he could not name but presume he is referring to Novolin 70/30, Novolin N, Novolin R).  Patient states that he let Clifford [name] know and "Clifford [name] said he did not want me on any of those".  Patient has also checked with Lantus manufacture regarding medication assistance and he was told that his house hold income was over the cut off by $46 so he did not qualify.   Inquired about knowledge about A1C and patient reports that he does not know what an A1C is. Discussed A1C results (9.1% on 02/09/14) and explained what an A1C is, basic pathophysiology of DM Type 2, basic home care, importance of checking CBGs and maintaining good CBG control to prevent long-term and short-term complications. Inquired about monitoring glucose and patient reports that he does not check his glucose at all, "they check it at the doctors office and the strips cost too much". Informed patient about the Reli-On glucometer ($15)  and test strips ($9 for a box of 50) which he could purchase at [facility]. Patient's wife states that she will be sure to buy this meter for him to use so the strips will be more affordable. Discussed impact of nutrition, exercise, stress, sickness, and medications on diabetes control.  Patient's wife states that she prepares all the  meals and that her husband will only eat what he likes and refuses to eat or try things he does not like. Patient stated several times during our conversation, "you only live once and I am going to do and eat what I like". Acknowledged his statement but stressed that if he did not make changes with his diet and diabetes control he may be shortening his own life.  Discussed carbohydrates, carbohydrate goals per day and meal, along with portion sizes. Patient's  wife states she would like additional information on meal planning; therefore, consulted RD. Patient states that he has an upcoming appoint with Clifford [name] on October 5th  to get help with improving diabetes control. Encouraged patient to be sure Clifford [name] is aware of the out of pocket cost of Lantus while he is in the doughnut hole. Asked patient check his glucose at least once a day but ideally 3-4 times per day so Clifford [name] will have more data to make adjustments with his diabetes medications. Patient verbalized understanding of information discussed and he states that he has no further questions at this time related to diabetes.   Thanks, Barnie Alderman, RN, MSN, CCRN Diabetes Coordinator Inpatient Diabetes Program [phone] (Team Pager) [phone] ([place] office) [phone] [facility] office)

## 2014-02-10 NOTE — Discharge Instructions (Signed)
Ischemic Stroke °A stroke (cerebrovascular accident) is the sudden death of brain tissue. It is a medical emergency. A stroke can cause permanent loss of brain function. This can cause problems with different parts of your body. A transient ischemic attack (TIA) is different because it does not cause permanent damage. A TIA is a short-lived problem of poor blood flow affecting a part of the brain. A TIA is also a serious problem because having a TIA greatly increases the chances of having a stroke. When symptoms first develop, you cannot know if the problem might be a stroke or a TIA. °CAUSES  °A stroke is caused by a decrease of oxygen supply to an area of your brain. It is usually the result of a small blood clot or collection of cholesterol or fat (plaque) that blocks blood flow in the brain. A stroke can also be caused by blocked or damaged carotid arteries.  °RISK FACTORS °· High blood pressure (hypertension). °· High cholesterol. °· Diabetes mellitus. °· Heart disease. °· The buildup of plaque in the blood vessels (peripheral artery disease or atherosclerosis). °· The buildup of plaque in the blood vessels providing blood and oxygen to the brain (carotid artery stenosis). °· An abnormal heart rhythm (atrial fibrillation). °· Obesity. °· Smoking. °· Taking oral contraceptives (especially in combination with smoking). °· Physical inactivity. °· A diet high in fats, salt (sodium), and calories. °· Alcohol use. °· Use of illegal drugs (especially cocaine and methamphetamine). °· Being African American. °· Being over the age of 55. °· Family history of stroke. °· Previous history of blood clots, stroke, TIA, or heart attack. °· Sickle cell disease. °SYMPTOMS  °These symptoms usually develop suddenly, or may be newly present upon awakening from sleep: °· Sudden weakness or numbness of the face, arm, or leg, especially on one side of the body. °· Sudden trouble walking or difficulty moving arms or legs. °· Sudden  confusion. °· Sudden personality changes. °· Trouble speaking (aphasia) or understanding. °· Difficulty swallowing. °· Sudden trouble seeing in one or both eyes. °· Double vision. °· Dizziness. °· Loss of balance or coordination. °· Sudden severe headache with no known cause. °· Trouble reading or writing. °DIAGNOSIS  °Your health care provider can often determine the presence or absence of a stroke based on your symptoms, history, and physical exam. Computed tomography (CT) of the brain is usually performed to confirm the stroke, determine causes, and determine stroke severity. Other tests may be done to find the cause of the stroke. These tests may include: °· Electrocardiography. °· Continuous heart monitoring. °· Echocardiography. °· Carotid ultrasonography. °· Magnetic resonance imaging (MRI). °· A scan of the brain circulation. °· Blood tests. °PREVENTION  °The risk of a stroke can be decreased by appropriately treating high blood pressure, high cholesterol, diabetes, heart disease, and obesity and by quitting smoking, limiting alcohol, and staying physically active. °TREATMENT  °Time is of the essence. It is important to seek treatment at the first sign of these symptoms because you may receive a medicine to dissolve the clot (thrombolytic) that cannot be given if too much time has passed since your symptoms began. Even if you do not know when your symptoms began, get treatment as soon as possible as there are other treatment options available including oxygen, intravenous (IV) fluids, and medicines to thin the blood (anticoagulants). Treatment of stroke depends on the duration, severity, and cause of your symptoms. Medicines and dietary changes may be used to address diabetes, high blood   pressure, and other risk factors. Physical, speech, and occupational therapists will assess you and work with you to improve any functions impaired by the stroke. Measures will be taken to prevent short-term and long-term  complications, including infection from breathing foreign material into the lungs (aspiration pneumonia), blood clots in the legs, bedsores, and falls. Rarely, surgery may be needed to remove large blood clots or to open up blocked arteries. °HOME CARE INSTRUCTIONS  °· Take medicines only as directed by your health care provider. Follow the directions carefully. Medicines may be used to control risk factors for a stroke. Be sure you understand all your medicine instructions. °· You may be told to take a medicine to thin the blood, such as aspirin or the anticoagulant warfarin. Warfarin needs to be taken exactly as instructed. °¨ Too much and too little warfarin are both dangerous. Too much warfarin increases the risk of bleeding. Too little warfarin continues to allow the risk for blood clots. While taking warfarin, you will need to have regular blood tests to measure your blood clotting time. These blood tests usually include both the PT and INR tests. The PT and INR results allow your health care provider to adjust your dose of warfarin. The dose can change for many reasons. It is critically important that you take warfarin exactly as prescribed, and that you have your PT and INR levels drawn exactly as directed. °¨ Many foods, especially foods high in vitamin K, can interfere with warfarin and affect the PT and INR results. Foods high in vitamin K include spinach, kale, broccoli, cabbage, collard and turnip greens, brussels sprouts, peas, cauliflower, seaweed, and parsley, as well as beef and pork liver, green tea, and soybean oil. You should eat a consistent amount of foods high in vitamin K. Avoid major changes in your diet, or notify your health care provider before changing your diet. Arrange a visit with a dietitian to answer your questions. °¨ Many medicines can interfere with warfarin and affect the PT and INR results. You must tell your health care provider about any and all medicines you take. This  includes all vitamins and supplements. Be especially cautious with aspirin and anti-inflammatory medicines. Do not take or discontinue any prescribed or over-the-counter medicine except on the advice of your health care provider or pharmacist. °¨ Warfarin can have side effects, such as excessive bruising or bleeding. You will need to hold pressure over cuts for longer than usual. Your health care provider or pharmacist will discuss other potential side effects. °¨ Avoid sports or activities that may cause injury or bleeding. °¨ Be mindful when shaving, flossing your teeth, or handling sharp objects. °¨ Alcohol can change the body's ability to handle warfarin. It is best to avoid alcoholic drinks or consume only very small amounts while taking warfarin. Notify your health care provider if you change your alcohol intake. °¨ Notify your dentist or other health care providers before procedures. °· If swallow studies have determined that your swallowing reflex is present, you should eat healthy foods. Including 5 or more servings of fruits and vegetables a day may reduce the risk of stroke. Foods may need to be a certain consistency (soft or pureed), or small bites may need to be taken in order to avoid aspirating or choking. Certain dietary changes may be advised to address high blood pressure, high cholesterol, diabetes, or obesity. °¨ Food choices that are low in sodium, saturated fat, trans fat, and cholesterol are recommended to manage high blood pressure. °¨   Food choies that are high in fiber, and low in saturated fat, trans fat, and cholesterol may control cholesterol levels. °¨ Controlling carbohydrates and sugar intake is recommended to manage diabetes. °¨ Reducing calorie intake and making food choices that are low in sodium, saturated fat, trans fat, and cholesterol are recommended to manage obesity. °· Maintain a healthy weight. °· Stay physically active. It is recommended that you get at least 30 minutes of  activity on all or most days. °· Do not use any tobacco products including cigarettes, chewing tobacco, or electronic cigarettes. °· Limit alcohol use even if you are not taking warfarin. Moderate alcohol use is considered to be: °¨ No more than 2 drinks each day for men. °¨ No more than 1 drink each day for nonpregnant women. °· Home safety. A safe home environment is important to reduce the risk of falls. Your health care provider may arrange for specialists to evaluate your home. Having grab bars in the bedroom and bathroom is often important. Your health care provider may arrange for equipment to be used at home, such as raised toilets and a seat for the shower. °· Physical, occupational, and speech therapy. Ongoing therapy may be needed to maximize your recovery after a stroke. If you have been advised to use a walker or a cane, use it at all times. Be sure to keep your therapy appointments. °· Follow all instructions for follow-up with your health care provider. This is very important. This includes any referrals, physical therapy, rehabilitation, and lab tests. Proper follow-up can prevent another stroke from occurring. °SEEK MEDICAL CARE IF: °· You have personality changes. °· You have difficulty swallowing. °· You are seeing double. °· You have dizziness. °· You have a fever. °· You have skin breakdown. °SEEK IMMEDIATE MEDICAL CARE IF:  °Any of these symptoms may represent a serious problem that is an emergency. Do not wait to see if the symptoms will go away. Get medical help right away. Call your local emergency services (911 in [place] Do not drive yourself to the hospital. °· You have sudden weakness or numbness of the face, arm, or leg, especially on one side of the body. °· You have sudden trouble walking or difficulty moving arms or legs. °· You have sudden confusion. °· You have trouble speaking (aphasia) or understanding. °· You have sudden trouble seeing in one or both eyes. °· You have a loss of  balance or coordination. °· You have a sudden, severe headache with no known cause. °· You have new chest pain or an irregular heartbeat. °· You have a partial or total loss of consciousness. °Document Released: 05/02/2005 Document Revised: 09/16/2013 Document Reviewed: 12/11/2011 °[employer]® Patient Information ©2015 [employer]. This information is not intended to replace advice given to you by your health care provider. Make sure you discuss any questions you have with your health care provider. ° °

## 2014-02-10 NOTE — Discharge Summary (Signed)
Physician Discharge Summary  Clifford [name] DDU:[id number] DOB: 06/24/1940 DOA: 02/08/2014  PCP: Manon Hilding, MD  Admit date: 02/08/2014 Discharge date: 02/10/2014  Time spent: 40 minutes  Recommendations for Outpatient Follow-up:  1. Patient will follow up with his primary care physician in one to 2 weeks 2. Continue to follow up with endocrinology for her thyroid workup 3. Procedure patient pulmonary function tests for chronic fatigue  Discharge Diagnoses:  Active Problems:   Atrial fibrillation   Chronic anticoagulation   CVA (cerebral infarction)   Diabetes mellitus  sick sinus syndrome status post pacemaker Thyromegaly with low TSH Hyperlipidemia Essential hypertension Chronic dyspnea  Discharge Condition: improved  Diet recommendation: low salt, low carb  Filed Weights   02/08/14 2236 02/09/14 0532 02/10/14 0557  Weight: 90 kg (198 lb 6.6 oz) 91 kg (200 lb 9.9 oz) 91.6 kg (201 lb 15.1 oz)    History of present illness:  This patient presents to the hospital with onset of left-sided weakness which began the night prior to admission. Patient woke up with symptoms at 3 AM of left leg weakness. Patient went back to sleep. When he woke up he had progressive weakness with left leg weakness as well as left arm weakness. He came to the emergency room for evaluation the CT scan of the head for stroke. He was admitted for further treatments  Hospital Course:  Patient was admitted to the hospital. Since he has a pacemaker in place, MRI cannot be pursued. He had another repeat CT scan that showed a focal area of low-attenuation in the right corona radiata and represent a subacute infarct. Patient is oriented quite later on Coumadin for atrial fibrillation. LDL was checked and was found to be below 100. Hemoglobin A1c was noted to be elevated will need to be adjusted in the outpatient setting. Hypertension has been stable. Carotid Doppler did not show any significant stenosis bilaterally  echocardiogram was unremarkable. He was seen by neurology who recommended changing anticoagulation from Coumadin to Eliquis. He was seen by physical therapy and was felt appropriate to discharge home.  Procedures: Echo:- Left ventricle: The cavity size was normal. Wall thickness was increased in a pattern of mild LVH. Systolic function was normal. The estimated ejection fraction was in the range of 60% to 65%. - Aortic valve: There was mild regurgitation. - Left atrium: The atrium was moderately to severely dilated.     Consultations:  neurology  Discharge Exam: Filed Vitals:   02/10/14 1649  BP: 120/70  Pulse: 75  Temp: 98 F (36.7 C)  Resp: 20    General: NAD Cardiovascular: s1, s2, irregular Respiratory: cta b  Discharge Instructions You were cared for by a hospitalist during your hospital stay. If you have any questions about your discharge medications or the care you received while you were in the hospital after you are discharged, you can call the unit and asked to speak with the hospitalist on call if the hospitalist that took care of you is not available. Once you are discharged, your primary care physician will handle any further medical issues. Please note that NO REFILLS for any discharge medications will be authorized once you are discharged, as it is imperative that you return to your primary care physician (or establish a relationship with a primary care physician if you do not have one) for your aftercare needs so that they can reassess your need for medications and monitor your lab values.  Discharge Instructions   Diet - low sodium  heart healthy    Complete by:  As directed      Diet Carb Modified    Complete by:  As directed      Increase activity slowly    Complete by:  As directed           Discharge Medication List as of 02/10/2014  7:55 PM    START taking these medications   Details  apixaban (ELIQUIS) 5 MG TABS tablet Take 1 tablet (5 mg total) by  mouth 2 (two) times daily. Start on 02/13/14, Starting 02/10/2014, Until Discontinued, Print      CONTINUE these medications which have NOT CHANGED   Details  aspirin EC 81 MG tablet Take 81 mg by mouth daily., Until Discontinued, Historical Med    FOLIC ACID PO Take 1 tablet by mouth daily., Until Discontinued, Historical Med    glipiZIDE (GLUCOTROL) 10 MG tablet Take 10 mg by mouth daily. , Until Discontinued, Historical Med    hydrochlorothiazide (HYDRODIURIL) 25 MG tablet Take 25 mg by mouth daily., Until Discontinued, Historical Med    HYDROcodone-acetaminophen (NORCO/VICODIN) 5-325 MG per tablet Take 1 tablet by mouth as needed for moderate pain or severe pain. , Until Discontinued, Historical Med    insulin glargine (LANTUS) 100 UNIT/ML injection Inject 70 Units into the skin at bedtime. , Until Discontinued, Historical Med    lisinopril (PRINIVIL,ZESTRIL) 20 MG tablet Take 20 mg by mouth daily., Until Discontinued, Historical Med    lovastatin (MEVACOR) 40 MG tablet Take 80 mg by mouth at bedtime. , Until Discontinued, Historical Med    metFORMIN (GLUCOPHAGE) 500 MG tablet Take 1,000 mg by mouth 2 (two) times daily with a meal. , Until Discontinued, Historical Med    methotrexate (RHEUMATREX) 2.5 MG tablet Take 10 mg by mouth once a week. On Mondays- Caution:Chemotherapy. Protect from light., Until Discontinued, Historical Med    Ranitidine HCl (ACID REDUCER PO) Take 1 tablet by mouth at bedtime as needed (Acid Reflux)., Until Discontinued, Historical Med    nitroGLYCERIN (NITROSTAT) 0.4 MG SL tablet Place 0.4 mg under the tongue every 5 (five) minutes as needed for chest pain. , Until Discontinued, Historical Med      STOP taking these medications     warfarin (COUMADIN) 3 MG tablet        Allergies  Allergen Reactions  . Demerol Nausea And Vomiting   Follow-up Information   Follow up with Manon Hilding, MD. Schedule an appointment as soon as possible for a visit in 2  weeks.   Specialty:  Family Medicine   Contact information:   [place] [address] [phone]        The results of significant diagnostics from this hospitalization (including imaging, microbiology, ancillary and laboratory) are listed below for reference.    Significant Diagnostic Studies: Dg Chest 2 View  02/09/2014   CLINICAL DATA:  Left-sided weakness, possible CVA  EXAM: CHEST  2 VIEW  COMPARISON:  None.  FINDINGS: There is no focal parenchymal opacity, pleural effusion, or pneumothorax. The heart and mediastinal contours are unremarkable. There is a dual lead AICD.  The osseous structures are unremarkable.  IMPRESSION: No active cardiopulmonary disease.   Electronically Signed   By: Kathreen Devoid   On: 02/09/2014 00:02   Ct Head Wo Contrast  02/10/2014   CLINICAL DATA:  Left-sided weakness.  EXAM: CT HEAD WITHOUT CONTRAST  TECHNIQUE: Contiguous axial images were obtained from the base of the skull through the vertex  without intravenous contrast.  COMPARISON:  02/08/2014.  FINDINGS: No evidence of an acute infarct, acute hemorrhage, mass lesion, mass effect or hydrocephalus. Atrophy. A small area of decreased attenuation is seen in the right corona radiata (series 2, image 18). Visualized portions of the paranasal sinuses and mastoid air cells are clear.  IMPRESSION: 1. Focal area of low attenuation in the right corona radiata may represent a subacute infarct. 2. Atrophy.   Electronically Signed   By: Lorin Picket M.D.   On: 02/10/2014 09:02   Ct Head Wo Contrast  02/08/2014   CLINICAL DATA:  Left-sided weakness.  EXAM: CT HEAD WITHOUT CONTRAST  TECHNIQUE: Contiguous axial images were obtained from the base of the skull through the vertex without intravenous contrast.  COMPARISON:  CT scan of December 05, 2010.  FINDINGS: Bony calvarium appears intact. Mild diffuse cortical atrophy is noted. Mild chronic ischemic white matter disease is noted. No mass effect or midline shift is  noted. Ventricular size is within normal limits. There is no evidence of mass lesion, hemorrhage or acute infarction.  IMPRESSION: Mild diffuse cortical atrophy. Mild chronic ischemic white matter disease. No acute intracranial abnormality seen.   Electronically Signed   By: Sabino Dick M.D.   On: 02/08/2014 20:02   US Soft Tissue Head/neck  01/27/2014   CLINICAL DATA:  Thyromegaly  EXAM: THYROID ULTRASOUND  TECHNIQUE: Ultrasound examination of the thyroid gland and adjacent soft tissues was performed.  COMPARISON:  None.  FINDINGS: There is relative homogeneity the thyroid parenchymal echotexture.  Right thyroid lobe  Measurements: Normal in size measuring 4.2 x 1.4 x 1.5 cm.  Right, inferior - 0.7 x 0.5 x 0.6 cm - hypoechoic, likely solid.  There are several additional scattered sub 3 mm anechoic nodules within the remainder of the right lobe of the thyroid which are favored to represent cysts.  Left thyroid lobe  Measurements: Borderline enlarged measuring 6.1 x 4.8 x 3.7 cm.  Left, superior - 3.9 x 2.0 x 3.7 cm- partially solid, predominantly cystic with blood flow within the solid component (representative image 82).  Isthmus  Thickness: Borderline enlarged measuring 0.5 cm in diameter.  No discrete nodules are identified within the thyroid isthmus.  Lymphadenopathy  None visualized.  IMPRESSION: Findings suggestive of multi nodular goiter. The dominant approximately 3.9 cm complex largely cystic mass within the superior pole of the left lobe of the thyroid meets imaging criteria to recommend percutaneous sampling as clinically indicated. This recommendation follows the consensus statement: Management of Thyroid Nodules Detected at [place]: Society of Radiologists in [place]. Radiology 2005; [id number].   Electronically Signed   By: Sandi Mariscal M.D.   On: 01/27/2014 09:49   US Carotid Bilateral  02/09/2014   CLINICAL DATA:  Cerebrovascular accident.  EXAM: BILATERAL CAROTID  DUPLEX ULTRASOUND  TECHNIQUE: Pearline Cables scale imaging, color Doppler and duplex ultrasound were performed of bilateral carotid and vertebral arteries in the neck.  COMPARISON:  None.  FINDINGS: Criteria: Quantification of carotid stenosis is based on velocity parameters that correlate the residual internal carotid diameter with NASCET-based stenosis levels, using the diameter of the distal internal carotid lumen as the denominator for stenosis measurement.  The following velocity measurements were obtained:  RIGHT  ICA:  78/18 cm/sec  CCA:  11/94 cm/sec  SYSTOLIC ICA/CCA RATIO:  1.74  DIASTOLIC ICA/CCA RATIO:  0.81  ECA:  85/15 cm/sec  LEFT  ICA:  39/18 cm/sec  CCA:  44/8 cm/sec  SYSTOLIC ICA/CCA RATIO:  0.10  DIASTOLIC ICA/CCA RATIO:  2.72  ECA:  111 cm/sec  RIGHT CAROTID ARTERY: Mild plaque formation is noted in the right carotid bulb and proximal right internal carotid artery consistent with less than 50% diameter stenosis based on ultrasound and Doppler criteria.  RIGHT VERTEBRAL ARTERY:  Antegrade flow is noted.  LEFT CAROTID ARTERY: Mild plaque formation is noted in the left carotid bulb and proximal left internal carotid artery consistent with less than 50% diameter stenosis based on ultrasound and Doppler criteria.  LEFT VERTEBRAL ARTERY:  Antegrade flow is noted.  IMPRESSION: Mild plaque formation is noted in both proximal internal carotid arteries consistent with less than 50% diameter stenosis based on ultrasound and Doppler criteria.   Electronically Signed   By: Sabino Dick M.D.   On: 02/09/2014 17:37    Microbiology: No results found for this or any previous visit (from the past 240 hour(s)).   Labs: Basic Metabolic Panel:  Recent Labs Lab 02/08/14 1857  NA 136*  K 4.0  CL 96  CO2 30  GLUCOSE 227*  BUN 18  CREATININE 0.76  CALCIUM 9.8   Liver Function Tests: No results found for this basename: AST, ALT, ALKPHOS, BILITOT, PROT, ALBUMIN,  in the last 168 hours No results found for this  basename: LIPASE, AMYLASE,  in the last 168 hours No results found for this basename: AMMONIA,  in the last 168 hours CBC:  Recent Labs Lab 02/08/14 1857 02/09/14 0556 02/10/14 0603  WBC 7.1 5.5 5.3  NEUTROABS 2.8  --   --   HGB 15.1 14.4 14.4  HCT 41.6 40.0 40.5  MCV 83.9 83.7 84.2  PLT 214 184 166   Cardiac Enzymes: No results found for this basename: CKTOTAL, CKMB, CKMBINDEX, TROPONINI,  in the last 168 hours BNP: BNP (last 3 results) No results found for this basename: PROBNP,  in the last 8760 hours CBG:  Recent Labs Lab 02/09/14 1654 02/09/14 2113 02/10/14 0729 02/10/14 1117 02/10/14 1658  GLUCAP 174* 280* 212* 270* 192*       Signed:  Shristi Scheib  [facility] 02/10/2014, 8:53 PM

## 2014-02-10 NOTE — Evaluation (Signed)
Physical Therapy Evaluation Patient Details Name: Clifford [name] MRN: [id number] DOB: 1940/12/08 Today's Date: 02/10/2014   History of Present Illness  73 year old male who  has a past medical history of Atrial fibrillation; Unspecified essential hypertension; Type II or unspecified type diabetes mellitus without mention of complication, not stated as uncontrolled; Rheumatoid arthritis(714.0); CAD (coronary artery disease); and Goiter.  Pt is admitted with left sided weakness and inability to walk.  He is normally independent with all ADLs and lives with his wife.  Clinical Impression   Pt was seen for evaluation.  On manual muscle test his strength in the trunk and LLE is 5/5.  His standing balance and coordination are decreased, however, which directly affect his gait.  He now requires a walker for gait and min assist.  His left knee occasionally buckles.  He was instructed in correct gait pattern and was able to perform correctly.  I am recommending HHPT at d/c.  No DME should be needed.    Follow Up Recommendations Home health PT    Equipment Recommendations  None recommended by PT    Recommendations for Other Services   OT    Precautions / Restrictions Precautions Precautions: Fall Restrictions Weight Bearing Restrictions: No      Mobility  Bed Mobility Overal bed mobility: Modified Independent                Transfers Overall transfer level: Modified independent                  Ambulation/Gait Ambulation/Gait assistance: Min assist Ambulation Distance (Feet): 100 Feet Assistive device: Rolling walker (2 wheeled) Gait Pattern/deviations:  (see comment below)   Gait velocity interpretation: at or above normal speed for age/gender General Gait Details: pt has difficulty with coordination of the left knee during gait...he is unable to fully extend the knee in stance and the knee with occasionally buckle  Stairs            Wheelchair Mobility     Modified Rankin (Stroke Patients Only) Modified Rankin (Stroke Patients Only) Pre-Morbid Rankin Score: No symptoms Modified Rankin: Moderately severe disability     Balance Overall balance assessment: Needs assistance Sitting-balance support: No upper extremity supported;Feet supported Sitting balance-Leahy Scale: Normal     Standing balance support: No upper extremity supported Standing balance-Leahy Scale: Fair                               Pertinent Vitals/Pain Pain Assessment: No/denies pain    Home Living Family/patient expects to be discharged to:: Private residence Living Arrangements: Spouse/significant other Available Help at Discharge: Family;Available 24 hours/day Type of Home: House Home Access: Level entry     Home Layout: One level Home Equipment: Walker - 2 wheels;Cane - single point;Shower seat      Prior Function Level of Independence: Independent               Hand Dominance   Dominant Hand: Right    Extremity/Trunk Assessment   Upper Extremity Assessment: Defer to OT evaluation           Lower Extremity Assessment: Overall WFL for tasks assessed      Cervical / Trunk Assessment: Normal  Communication   Communication: No difficulties  Cognition Arousal/Alertness: Awake/alert Behavior During Therapy: WFL for tasks assessed/performed Overall Cognitive Status: Within Functional Limits for tasks assessed  General Comments      Exercises        Assessment/Plan    PT Assessment Patient needs continued PT services  PT Diagnosis Difficulty walking;Abnormality of gait   PT Problem List Decreased activity tolerance;Decreased balance;Decreased mobility;Decreased coordination;Decreased safety awareness  PT Treatment Interventions Gait training;Functional mobility training;Therapeutic exercise   PT Goals (Current goals can be found in the Care Plan section) Acute Rehab PT Goals Patient  Stated Goal: return to independence PT Goal Formulation: With patient/family Time For Goal Achievement: 03/19/14 Potential to Achieve Goals: Good    Frequency Min 3X/week   Barriers to discharge  none      Co-evaluation               End of Session Equipment Utilized During Treatment: Gait belt Activity Tolerance: Patient tolerated treatment well Patient left: in bed;with call bell/phone within reach;with bed alarm set;with family/visitor present Nurse Communication: Mobility status         Time: 0940-1010 PT Time Calculation (min): 30 min   Charges:   PT Evaluation $Initial PT Evaluation Tier I: 1 Procedure     PT G CodesDemetrios [name] L 02/10/2014, 10:19 AM

## 2014-02-10 NOTE — Evaluation (Deleted)
Physical Therapy Evaluation Patient Details Name: Clifford [name] MRN: [id number] DOB: 1940/12/24 Today's Date: 02/10/2014   History of Present Illness  73 year old male who  has a past medical history of Atrial fibrillation; Unspecified essential hypertension; Type II or unspecified type diabetes mellitus without mention of complication, not stated as uncontrolled; Rheumatoid arthritis(714.0); CAD (coronary artery disease); and Goiter.  He is admitted with left sided weakness of recent onset, unable to ambulate.  Pt lives at home with wife and is normally independent with all ADLs.  Clinical Impression      Follow Up Recommendations Home health PT    Equipment Recommendations  None recommended by PT    Recommendations for Other Services       Precautions / Restrictions Precautions Precautions: Fall Restrictions Weight Bearing Restrictions: No      Mobility  Bed Mobility Overal bed mobility: Modified Independent                Transfers Overall transfer level: Modified independent                  Ambulation/Gait Ambulation/Gait assistance: Min assist Ambulation Distance (Feet): 100 Feet Assistive device: Rolling walker (2 wheeled) Gait Pattern/deviations:  (see comment below)   Gait velocity interpretation: at or above normal speed for age/gender General Gait Details: pt has difficulty with coordination of the left knee during gait...he is unable to fully extend the knee in stance and the knee with occasionally buckle  Stairs            Wheelchair Mobility    Modified Rankin (Stroke Patients Only) Modified Rankin (Stroke Patients Only) Pre-Morbid Rankin Score: No symptoms Modified Rankin: Moderately severe disability     Balance Overall balance assessment: Needs assistance Sitting-balance support: No upper extremity supported;Feet supported Sitting balance-Leahy Scale: Normal     Standing balance support: No upper extremity  supported Standing balance-Leahy Scale: Fair                               Pertinent Vitals/Pain Pain Assessment: No/denies pain    Home Living Family/patient expects to be discharged to:: Private residence Living Arrangements: Spouse/significant other Available Help at Discharge: Family;Available 24 hours/day Type of Home: House Home Access: Level entry     Home Layout: One level Home Equipment: Walker - 2 wheels;Cane - single point;Shower seat      Prior Function Level of Independence: Independent               Hand Dominance   Dominant Hand: Right    Extremity/Trunk Assessment   Upper Extremity Assessment: Defer to OT evaluation           Lower Extremity Assessment: Overall WFL for tasks assessed      Cervical / Trunk Assessment: Normal  Communication   Communication: No difficulties  Cognition Arousal/Alertness: Awake/alert Behavior During Therapy: WFL for tasks assessed/performed Overall Cognitive Status: Within Functional Limits for tasks assessed                      General Comments      Exercises        Assessment/Plan    PT Assessment Patient needs continued PT services  PT Diagnosis Difficulty walking;Abnormality of gait   PT Problem List Decreased activity tolerance;Decreased balance;Decreased mobility;Decreased coordination;Decreased safety awareness  PT Treatment Interventions Gait training;Functional mobility training;Therapeutic exercise   PT Goals (Current goals can be found in  the Care Plan section) Acute Rehab PT Goals Patient Stated Goal: return to independence PT Goal Formulation: With patient/family Time For Goal Achievement: 03/19/14 Potential to Achieve Goals: Good    Frequency Min 3X/week   Barriers to discharge        Co-evaluation               End of Session Equipment Utilized During Treatment: Gait belt Activity Tolerance: Patient tolerated treatment well Patient left: in  bed;with call bell/phone within reach;with bed alarm set;with family/visitor present Nurse Communication: Mobility status         Time: 0940-1010 PT Time Calculation (min): 30 min   Charges:   PT Evaluation $Initial PT Evaluation Tier I: 1 Procedure     PT G CodesDemetrios Isaacs L 02/10/2014, 10:17 AM

## 2014-02-10 NOTE — Evaluation (Signed)
Occupational Therapy Evaluation Patient Details Name: Clifford [name] MRN: [id number] DOB: Dec 14, 1940 Today's Date: 02/10/2014    History of Present Illness 73 year old male who  has a past medical history of Atrial fibrillation; Unspecified essential hypertension; Type II or unspecified type diabetes mellitus without mention of complication, not stated as uncontrolled; Rheumatoid arthritis(714.0); CAD (coronary artery disease); and Goiter.   Clinical Impression   Pt is presenting to acute OT with above situation.  He has baseline WFL strength, ROM, and sensation.  Pt is presenting with slight tingling sensations in L hand, but tingling has improved significantly over past few days.  OTR believes remaninig sensations changes will resolve.  At this time pt does not need any OT follow up, but encouraged pt to discuss need for OT with HHPT if changes in status occur.  Pt id discharged from acute OT at this time.    Follow Up Recommendations  No OT follow up    Equipment Recommendations  None recommended by OT    Recommendations for Other Services       Precautions / Restrictions Precautions Precautions: Fall Restrictions Weight Bearing Restrictions: No      Mobility Bed Mobility Overal bed mobility: Modified Independent                Transfers                      Balance   Sitting-balance support: No upper extremity supported Sitting balance-Leahy Scale: Normal                                      ADL Overall ADL's : At baseline                                             Vision                     Perception     Praxis      Pertinent Vitals/Pain Pain Assessment: No/denies pain     Hand Dominance Right   Extremity/Trunk Assessment Upper Extremity Assessment Upper Extremity Assessment: LUE deficits/detail;Overall [facility] for tasks assessed LUE Deficits / Details: 50% shoulder strenght (due to deficits from  arthritis and previous shoulder injury - pt and son state deficits are normal); slight decrease in grip strenght and fine motor coordination versus right, but functional.  WFL elbow strength.  LUE Coordination: decreased fine motor   Lower Extremity Assessment Lower Extremity Assessment: Defer to PT evaluation       Communication Communication Communication: No difficulties   Cognition Arousal/Alertness: Awake/alert Behavior During Therapy: WFL for tasks assessed/performed Overall Cognitive Status: Within Functional Limits for tasks assessed                     General Comments       Exercises       Shoulder Instructions      Home Living Family/patient expects to be discharged to:: Private residence Living Arrangements: Spouse/significant other Available Help at Discharge: Family;Available 24 hours/day Type of Home: House Home Access: Level entry     Home Layout: One level;Laundry or work area in basement (pt states he has not been going into basement since his back surgery)     Bathroom  Shower/Tub: Teacher, early years/pre: Standard     Home Equipment: Environmental consultant - 2 wheels;Cane - single point;Shower seat;Bedside commode;Grab bars - tub/shower;Hand held shower head          Prior Functioning/Environment Level of Independence: Independent             OT Diagnosis:     OT Problem List:     OT Treatment/Interventions:      OT Goals(Current goals can be found in the care plan section) Acute Rehab OT Goals Patient Stated Goal: return to independence - no OT goals needed OT Goal Formulation: With patient/family  OT Frequency:     Barriers to D/C:            Co-evaluation              End of Session    Activity Tolerance: Patient tolerated treatment well Patient left: in bed;with nursing/sitter in room;with call bell/phone within reach   Time: 1446-1516 OT Time Calculation (min): 30 min Charges:  OT General Charges $OT Visit:  1 Procedure OT Evaluation $Initial OT Evaluation Tier I: 1 Procedure G-Codes:     Bea Graff, MS, OTR/L [phone]  02/10/2014, 4:03 PM

## 2014-02-10 NOTE — Consult Note (Signed)
Wilson A. Merlene Laughter, MD     [email or web address]          Clifford [name] is an 73 y.o. male.   ASSESSMENT/PLAN: 1. Acute focal neurological Deficits of unclear etiology. This may represent acute infarct although on therapeutic warfarin. We may consider switching to one in newer agents.   2. On antiplatelet agents and anticoagulation. Presumably on aspirin for coronary protection although at increased risk of hemorrhage with both medications.    I suggest that the patient be switched from warfarin to one of the new agents. Eliquis is suggested given that he is had a stroke on warfarin. It appears cardiology once the patient to be on aspirin for coronary protection.      This is a 73 year old white male who went to bed on Friday at approximately 1229. He was normal at that time awoke him about an hour and a half later and noticed that the left leg was mildly weak. He woke up the follow morning and noticed that he continues to have weakness of the left lower extremity. However, as the day progresses symptoms got progressively worse. He did not seek medical attention because of having to go to a funeral. It appears he also had some weakness of left upper extremity but the primary was the left leg. The symptoms were quite severe to the point that he was not able to walk while he attended the funeral. He subsequently came to the hospital here for further evaluation. An initial CT was read as unremarkable. He did have a repeat scan reviewed below. The patient has been a warfarin for chronic atrial fibrillation. He also is on aspirin before coronary disease. He tells me that he has been compliant with both medications. His INR was therapeutic. The patient reports significant improvement in his symptoms since been hospitalized over the last couple days. He has ambulated in the halls with this walker. The review of systems otherwise negative.     GENERAL: This very pleasant man  in no acute distress.  HEENT: Supple. Atraumatic normocephalic.   ABDOMEN: soft  EXTREMITIES: No edema. Significant arthritic changes of the hands and knees bilaterally.   BACK: Normal.  SKIN: Normal by inspection.    MENTAL STATUS: Alert and oriented. Speech, language and cognition are generally intact. Judgment and insight normal.   CRANIAL NERVES: Pupils are equal, round and reactive to light and accommodation; extra ocular movements are full, there is no significant nystagmus; visual fields are full; upper and lower facial muscles are normal in strength and symmetric, there is no flattening of the nasolabial folds; tongue is midline; uvula is midline; shoulder elevation is normal.  MOTOR: Normal tone, bulk and strength; no pronator drift.  COORDINATION: Left finger to nose is normal, right finger to nose is normal, No rest tremor; no intention tremor; no postural tremor; no bradykinesia.  REFLEXES: Deep tendon reflexes are symmetrical and normal.   SENSATION: Normal to light touch.   Blood pressure 120/70, pulse 75, temperature 98 F (36.7 C), temperature source Oral, resp. rate 20, height $RemoveBe'5\' 11"'HvifWzvIi$  (1.803 m), weight 91.6 kg (201 lb 15.1 oz), SpO2 99.00%.  Past Medical History  Diagnosis Date  . Atrial fibrillation     Patient on amiodarone  . Unspecified essential hypertension   . Type II or unspecified type diabetes mellitus without mention of complication, not stated as uncontrolled   . Rheumatoid arthritis(714.0)   . CAD (coronary artery disease)   . Goiter  Past Surgical History  Procedure Laterality Date  . Pacemaker insertion      History reviewed. No pertinent family history.  Social History:  reports that he quit smoking about 18 years ago. His smoking use included Cigarettes. He started smoking about 69 years ago. He has a 100 pack-year smoking history. He has never used smokeless tobacco. He reports that he does not drink alcohol or use illicit  drugs.  Allergies:  Allergies  Allergen Reactions  . Demerol Nausea And Vomiting    Medications: Prior to Admission medications   Medication Sig Start Date End Date Taking? Authorizing Provider  aspirin EC 81 MG tablet Take 81 mg by mouth daily.   Yes Historical Provider, MD  FOLIC ACID PO Take 1 tablet by mouth daily.   Yes Historical Provider, MD  glipiZIDE (GLUCOTROL) 10 MG tablet Take 10 mg by mouth daily.    Yes Historical Provider, MD  hydrochlorothiazide (HYDRODIURIL) 25 MG tablet Take 25 mg by mouth daily.   Yes Historical Provider, MD  HYDROcodone-acetaminophen (NORCO/VICODIN) 5-325 MG per tablet Take 1 tablet by mouth as needed for moderate pain or severe pain.    Yes Historical Provider, MD  insulin glargine (LANTUS) 100 UNIT/ML injection Inject 70 Units into the skin at bedtime.    Yes Historical Provider, MD  lisinopril (PRINIVIL,ZESTRIL) 20 MG tablet Take 20 mg by mouth daily.   Yes Historical Provider, MD  lovastatin (MEVACOR) 40 MG tablet Take 80 mg by mouth at bedtime.    Yes Historical Provider, MD  metFORMIN (GLUCOPHAGE) 500 MG tablet Take 1,000 mg by mouth 2 (two) times daily with a meal.    Yes Historical Provider, MD  methotrexate (RHEUMATREX) 2.5 MG tablet Take 10 mg by mouth once a week. On Mondays- Caution:Chemotherapy. Protect from light.   Yes Historical Provider, MD  Ranitidine HCl (ACID REDUCER PO) Take 1 tablet by mouth at bedtime as needed (Acid Reflux).   Yes Historical Provider, MD  warfarin (COUMADIN) 3 MG tablet Take 6 mg by mouth daily.    Yes Historical Provider, MD  nitroGLYCERIN (NITROSTAT) 0.4 MG SL tablet Place 0.4 mg under the tongue every 5 (five) minutes as needed for chest pain.     Historical Provider, MD    Scheduled Meds: . aspirin EC  81 mg Oral Daily  . insulin aspart  0-9 Units Subcutaneous TID WC  . insulin glargine  70 Units Subcutaneous QHS  . simvastatin  40 mg Oral q1800  . Warfarin - Pharmacist Dosing Inpatient   Does not apply  Q24H   Continuous Infusions:  PRN Meds:.     Results for orders placed during the hospital encounter of 02/08/14 (from the past 48 hour(s))  BASIC METABOLIC PANEL     Status: Abnormal   Collection Time    02/08/14  6:57 PM      Result Value Ref Range   Sodium 136 (*) 137 - 147 mEq/L   Potassium 4.0  3.7 - 5.3 mEq/L   Chloride 96  96 - 112 mEq/L   CO2 30  19 - 32 mEq/L   Glucose, Bld 227 (*) 70 - 99 mg/dL   BUN 18  6 - 23 mg/dL   Creatinine, Ser 0.76  0.50 - 1.35 mg/dL   Calcium 9.8  8.4 - 10.5 mg/dL   GFR calc non Af Amer 88 (*) >90 mL/min   GFR calc Af Amer >90  >90 mL/min   Comment: (NOTE)     The eGFR  has been calculated using the CKD EPI equation.     This calculation has not been validated in all clinical situations.     eGFR's persistently <90 mL/min signify possible Chronic Kidney     Disease.   Anion gap 10  5 - 15  CBC WITH DIFFERENTIAL     Status: Abnormal   Collection Time    02/08/14  6:57 PM      Result Value Ref Range   WBC 7.1  4.0 - 10.5 K/uL   RBC 4.96  4.22 - 5.81 MIL/uL   Hemoglobin 15.1  13.0 - 17.0 g/dL   HCT 41.6  39.0 - 52.0 %   MCV 83.9  78.0 - 100.0 fL   MCH 30.4  26.0 - 34.0 pg   MCHC 36.3 (*) 30.0 - 36.0 g/dL   RDW 13.5  11.5 - 15.5 %   Platelets 214  150 - 400 K/uL   Neutrophils Relative % 40 (*) 43 - 77 %   Neutro Abs 2.8  1.7 - 7.7 K/uL   Lymphocytes Relative 44  12 - 46 %   Lymphs Abs 3.1  0.7 - 4.0 K/uL   Monocytes Relative 11  3 - 12 %   Monocytes Absolute 0.8  0.1 - 1.0 K/uL   Eosinophils Relative 5  0 - 5 %   Eosinophils Absolute 0.4  0.0 - 0.7 K/uL   Basophils Relative 0  0 - 1 %   Basophils Absolute 0.0  0.0 - 0.1 K/uL  PROTIME-INR     Status: Abnormal   Collection Time    02/08/14  6:57 PM      Result Value Ref Range   Prothrombin Time 30.0 (*) 11.6 - 15.2 seconds   INR 2.86 (*) 0.00 - 1.49  PROTIME-INR     Status: Abnormal   Collection Time    02/09/14  5:56 AM      Result Value Ref Range   Prothrombin Time 29.0 (*)  11.6 - 15.2 seconds   INR 2.74 (*) 0.00 - 1.49  CBC     Status: None   Collection Time    02/09/14  5:56 AM      Result Value Ref Range   WBC 5.5  4.0 - 10.5 K/uL   RBC 4.78  4.22 - 5.81 MIL/uL   Hemoglobin 14.4  13.0 - 17.0 g/dL   HCT 40.0  39.0 - 52.0 %   MCV 83.7  78.0 - 100.0 fL   MCH 30.1  26.0 - 34.0 pg   MCHC 36.0  30.0 - 36.0 g/dL   RDW 13.4  11.5 - 15.5 %   Platelets 184  150 - 400 K/uL  HEMOGLOBIN A1C     Status: Abnormal   Collection Time    02/09/14  5:56 AM      Result Value Ref Range   Hemoglobin A1C 9.1 (*) <5.7 %   Comment: (NOTE)                                                                               According to the ADA Clinical Practice Recommendations for 2011, when     HbA1c is used as a  screening test:      >=6.5%   Diagnostic of Diabetes Mellitus               (if abnormal result is confirmed)     5.7-6.4%   Increased risk of developing Diabetes Mellitus     References:Diagnosis and Classification of Diabetes Mellitus,Diabetes     GEXB,2841,32(GMWNU 1):S62-S69 and Standards of Medical Care in             Diabetes - 2011,Diabetes Care,2011,34 (Suppl 1):S11-S61.   Mean Plasma Glucose 214 (*) <117 mg/dL   Comment: Performed at [place]: Abnormal   Collection Time    02/09/14  5:56 AM      Result Value Ref Range   Cholesterol 153  0 - 200 mg/dL   Triglycerides 139  <150 mg/dL   HDL 38 (*) >39 mg/dL   Total CHOL/HDL Ratio 4.0     VLDL 28  0 - 40 mg/dL   LDL Cholesterol 87  0 - 99 mg/dL   Comment:            Total Cholesterol/HDL:CHD Risk     Coronary Heart Disease Risk Table                         Men   Women      1/2 Average Risk   3.4   3.3      Average Risk       5.0   4.4      2 X Average Risk   9.6   7.1      3 X Average Risk  23.4   11.0                Use the calculated Patient Ratio     above and the CHD Risk Table     to determine the patient's CHD Risk.                ATP III CLASSIFICATION  (LDL):      <100     mg/dL   Optimal      100-129  mg/dL   Near or Above                        Optimal      130-159  mg/dL   Borderline      160-189  mg/dL   High      >190     mg/dL   Very High  GLUCOSE, CAPILLARY     Status: Abnormal   Collection Time    02/09/14  7:49 AM      Result Value Ref Range   Glucose-Capillary 201 (*) 70 - 99 mg/dL   Comment 1 Notify RN    GLUCOSE, CAPILLARY     Status: Abnormal   Collection Time    02/09/14 11:53 AM      Result Value Ref Range   Glucose-Capillary 256 (*) 70 - 99 mg/dL   Comment 1 Notify RN    GLUCOSE, CAPILLARY     Status: Abnormal   Collection Time    02/09/14  4:54 PM      Result Value Ref Range   Glucose-Capillary 174 (*) 70 - 99 mg/dL   Comment 1 Notify RN    GLUCOSE, CAPILLARY     Status: Abnormal   Collection Time    02/09/14  9:13  PM      Result Value Ref Range   Glucose-Capillary 280 (*) 70 - 99 mg/dL   Comment 1 Notify RN    PROTIME-INR     Status: Abnormal   Collection Time    02/10/14  6:03 AM      Result Value Ref Range   Prothrombin Time 26.7 (*) 11.6 - 15.2 seconds   INR 2.46 (*) 0.00 - 1.49  CBC     Status: None   Collection Time    02/10/14  6:03 AM      Result Value Ref Range   WBC 5.3  4.0 - 10.5 K/uL   RBC 4.81  4.22 - 5.81 MIL/uL   Hemoglobin 14.4  13.0 - 17.0 g/dL   HCT 40.5  39.0 - 52.0 %   MCV 84.2  78.0 - 100.0 fL   MCH 29.9  26.0 - 34.0 pg   MCHC 35.6  30.0 - 36.0 g/dL   RDW 13.5  11.5 - 15.5 %   Platelets 166  150 - 400 K/uL  GLUCOSE, CAPILLARY     Status: Abnormal   Collection Time    02/10/14  7:29 AM      Result Value Ref Range   Glucose-Capillary 212 (*) 70 - 99 mg/dL   Comment 1 Notify RN    GLUCOSE, CAPILLARY     Status: Abnormal   Collection Time    02/10/14 11:17 AM      Result Value Ref Range   Glucose-Capillary 270 (*) 70 - 99 mg/dL   Comment 1 Notify RN      Studies/Results:  Carotid Doppler shows no hemodynamic significant stenosis.   The initial scan is reviewed.  There is some mild subtle hypophonia density involving the deep white matter areas next to the lateral ventricle. This area blossoms on the repeat scan as outlined below indicating an acute infarct.  Brain CT scan is reviewed in person. There is a moderate size hypodensity involving the deep white matter abutting the lateral ventricle on the right side extending to the basal ganglia. It seems more consistent with a subacute/chronic infarct area acute infarcts are seen. There is mild to moderate generalized atrophy.  Jennfier Abdulla A. Merlene Laughter, M.D.  Diplomate, Tax adviser of Psychiatry and Neurology ( Neurology). 02/10/2014, 5:08 PM

## 2014-02-10 NOTE — Progress Notes (Signed)
OT Cancellation Note  Patient Details Name: Clifford [name] MRN: [id number] DOB: Sep 27, 1940   Cancelled Treatment:    Reason Eval/Treat Not Completed: Patient at procedure or test/ unavailable (Pt receiving PT services at this time.  Will re-attempt OT evaluation at later time/date.)  Bea Graff, [place], OTR/L [phone]  02/10/2014, 9:49 AM

## 2014-02-10 NOTE — Care Management Note (Addendum)
    Page 1 of 1   02/11/2014     11:33:12 AM CARE MANAGEMENT NOTE 02/11/2014  Patient:  Clifford [name], Clifford [name]   Account Number:  [id number]  Date Initiated:  02/10/2014  Documentation initiated by:  Theophilus Kinds  Subjective/Objective Assessment:   Pt admitted from home with CVA. Pt lives with his wife and will return home at discharge. Pt is fairly independent with ADL's.     Action/Plan:   PT recommends HH PT. Arranged with [facility]. Will fax [facility] and demographics once placed in computer. Pt for discharge today. Pt and pts RN aware of discharge arrangements.   Anticipated DC Date:  02/10/2014   Anticipated DC Plan:  [place]  CM consult      [facility] Choice  HOME HEALTH   Choice offered to / List presented to:  C-1 Patient        [place] arranged  HH-2 PT      [facility]   Status of service:  Completed, signed off Medicare Important Message given?  NA - LOS <3 / Initial given by admissions (If response is "NO", the following Medicare IM given date fields will be blank) Date Medicare IM given:   Medicare IM given by:   Date Additional Medicare IM given:   Additional Medicare IM given by:    Discharge Disposition:  [place]  Per UR Regulation:    If discussed at Long Length of Stay Meetings, dates discussed:    Comments:  02/11/14 [place], RN BSN CM Home health orders not placed by MD at discharge. Cm spoke with pts PCP office and they will arrange the [facility] PT with [facility].  02/10/14 [place], RN BSN CM

## 2014-02-10 NOTE — Clinical Documentation Improvement (Signed)
Possible Clinical Conditions? _______Left side hemiparesis secondary to CVA _______Left side hemiplegia secondary to CVA  _______Other Condition__________________ _______Cannot Clinically Determine   Supporting Information: Per 02/10/14 PT eval. = Pt is admitted with left sided weakness and inability to walk.  Per 9/27/ 15 MD progress note = Acute CVA. Patient is noted with acute onset of left upper and lower extremity weakness. He was not felt a candidate for TPA since he woke up with his symptoms and then delayed coming to the hospital. He is also anticoagulated on Coumadin. He has undergone a 2-D echocardiogram today. Will followup carotid Dopplers. Since he has a &pacemaker in place, he is not a candidate for MRI. We'll repeat CT in the morning to check for any evolving strokes. Neurology has been consulted, await input. He is Anticoagulated with Coumadin and takes aspirin as well. He likely does not need any further antiplatelet therapy. Patient declined any &physical therapy evaluations today.    Thank You, Serena Colonel ,RN Clinical Documentation Specialist:  [place] Information Management

## 2014-02-11 NOTE — Progress Notes (Signed)
NURSING PROGRESS NOTE  Clifford [name] [id number] Discharge Data: 02/11/2014 12:28 AM Attending Provider: No att. providers found JQB:[id number],TKWI [name], MD   Wynelle Beckmann to be D/C'd Home per MD order.    All IV's discontinued and monitored for bleeding.  All belongings returned to patient for patient to take home.  AVS summary and Stroke education reviewed with patient and spouse.  Patient left floor via wheelchair, escorted by Animal nutritionist.  Last Documented Vital Signs:  Blood pressure 120/70, pulse 75, temperature 98 F (36.7 C), temperature source Oral, resp. rate 20, height 5\' 11"  (1.803 m), weight 91.6 kg (201 lb 15.1 oz), SpO2 99.00%.  Cecilie Kicks D

## 2014-02-13 DIAGNOSIS — I639 Cerebral infarction, unspecified: Secondary | ICD-10-CM

## 2014-02-13 HISTORY — DX: Cerebral infarction, unspecified: I63.9

## 2014-02-17 ENCOUNTER — Ambulatory Visit (HOSPITAL_COMMUNITY)
Admission: RE | Admit: 2014-02-17 | Discharge: 2014-02-17 | Disposition: A | Discharge status: Home / Self Care | Payer: PRIVATE HEALTH INSURANCE | Source: Ambulatory Visit | Attending: Cardiovascular Disease | Admitting: Cardiovascular Disease

## 2014-02-17 ENCOUNTER — Encounter (HOSPITAL_COMMUNITY): Payer: PRIVATE HEALTH INSURANCE

## 2014-02-17 DIAGNOSIS — Z720 Tobacco use: Secondary | ICD-10-CM

## 2014-02-17 DIAGNOSIS — R0600 Dyspnea, unspecified: Secondary | ICD-10-CM | POA: Insufficient documentation

## 2014-02-17 DIAGNOSIS — R0609 Other forms of dyspnea: Secondary | ICD-10-CM

## 2014-02-17 DIAGNOSIS — Z87891 Personal history of nicotine dependence: Secondary | ICD-10-CM | POA: Diagnosis not present

## 2014-02-17 LAB — PULMONARY FUNCTION TEST
DL/VA % PRED: 72 %
DL/VA: 3.38 ml/min/mmHg/L
DLCO UNC % PRED: 63 %
DLCO cor % pred: 63 %
DLCO cor: 21.43 ml/min/mmHg
DLCO unc: 21.43 ml/min/mmHg
FEF 25-75 Post: 2.08 L/sec
FEF 25-75 Pre: 1.63 L/sec
FEF2575-%CHANGE-POST: 27 %
FEF2575-%Pred-Post: 86 %
FEF2575-%Pred-Pre: 67 %
FEV1-%Change-Post: 5 %
FEV1-%Pred-Post: 92 %
FEV1-%Pred-Pre: 87 %
FEV1-PRE: 2.86 L
FEV1-Post: 3.02 L
FEV1FVC-%Change-Post: -2 %
FEV1FVC-%Pred-Pre: 93 %
FEV6-%Change-Post: 7 %
FEV6-%Pred-Post: 105 %
FEV6-%Pred-Pre: 98 %
FEV6-POST: 4.43 L
FEV6-Pre: 4.14 L
FEV6FVC-%CHANGE-POST: 0 %
FEV6FVC-%Pred-Post: 103 %
FEV6FVC-%Pred-Pre: 104 %
FVC-%Change-Post: 8 %
FVC-%PRED-PRE: 94 %
FVC-%Pred-Post: 101 %
FVC-PRE: 4.2 L
FVC-Post: 4.54 L
PRE FEV6/FVC RATIO: 99 %
Post FEV1/FVC ratio: 67 %
Post FEV6/FVC ratio: 98 %
Pre FEV1/FVC ratio: 68 %
RV % pred: 158 %
RV: 4.09 L
TLC % pred: 126 %
TLC: 9.19 L

## 2014-02-17 MED ORDER — ALBUTEROL SULFATE (2.5 MG/3ML) 0.083% IN NEBU
2.5000 mg | INHALATION_SOLUTION | Freq: Once | RESPIRATORY_TRACT | Status: AC
  Administered 2014-02-17: 2.5 mg via RESPIRATORY_TRACT

## 2014-02-20 ENCOUNTER — Telehealth: Payer: Self-pay | Admitting: Cardiovascular Disease

## 2014-02-20 NOTE — Telephone Encounter (Signed)
Mrs. Bangs states that Mr. Homewood had a stroke and was inpatient at [facility]. Mr. Paglia saw Dr. Dorris Fetch in his office on 02-17-14. They are awaiting a telephone call from Dr. Dorris Fetch office to schedule the biopsy. Dr. Dorris Fetch has placed Mr. Bowens on a special diet.

## 2014-02-21 ENCOUNTER — Other Ambulatory Visit (HOSPITAL_COMMUNITY): Payer: Self-pay | Admitting: "Endocrinology

## 2014-02-21 ENCOUNTER — Telehealth: Payer: Self-pay | Admitting: *Deleted

## 2014-02-21 DIAGNOSIS — E041 Nontoxic single thyroid nodule: Secondary | ICD-10-CM

## 2014-02-21 NOTE — Telephone Encounter (Signed)
Received fax from [facility] - patient scheduled for thyroid biopsy on 03/06/2014 at 10:00 a.m.   Request to stop Eliquis x 5 days prior to this procedure.   Should patient be bridged with Lovenox in light of recent stroke & Eliquis was only started on 02/10/2014.  His anticoagulation has been managed by PMD in the past.

## 2014-02-21 NOTE — Telephone Encounter (Signed)
Spoke with patient.  I called Dr Dorris Fetch office and requested them to fax information about upcoming biopsy and when they needed Clifford [name] to stop his Eliquis. Also asked Maudie Mercury to call patient and clarify appointment dates

## 2014-02-24 NOTE — Telephone Encounter (Signed)
Will fax this info to [facility].

## 2014-02-24 NOTE — Telephone Encounter (Signed)
As this has been managed by PCP, would defer that discussion between PCP and patient. My recommendation would be to hold Eliquis 24 hours prior to procedure as this is a thyroid biopsy and that is the recommendation by the manufacturer. I do not feel bridging is necessary as the patient should be able to safely resume apixaban shortly after the procedure.

## 2014-03-03 ENCOUNTER — Telehealth: Payer: Self-pay | Admitting: Cardiovascular Disease

## 2014-03-03 NOTE — Telephone Encounter (Signed)
lovastatin (MEVACOR) 40 MG tablet medication question

## 2014-03-04 NOTE — Telephone Encounter (Signed)
Per previous telephone notes, PMD had been managing his cholesterol.

## 2014-03-04 NOTE — Telephone Encounter (Signed)
Patient walked in stating we did not have to call him back about this medication because his PCP will be handling it.

## 2014-03-06 ENCOUNTER — Encounter (HOSPITAL_COMMUNITY): Payer: Self-pay

## 2014-03-06 ENCOUNTER — Ambulatory Visit (HOSPITAL_COMMUNITY)
Admission: RE | Admit: 2014-03-06 | Discharge: 2014-03-06 | Disposition: A | Discharge status: Home / Self Care | Payer: PRIVATE HEALTH INSURANCE | Source: Ambulatory Visit | Attending: "Endocrinology | Admitting: "Endocrinology

## 2014-03-06 DIAGNOSIS — E041 Nontoxic single thyroid nodule: Secondary | ICD-10-CM

## 2014-03-06 MED ORDER — LIDOCAINE HCL (PF) 2 % IJ SOLN
INTRAMUSCULAR | Status: AC
  Filled 2014-03-06: qty 10

## 2014-03-06 NOTE — Discharge Instructions (Signed)
Thyroid Biopsy °The thyroid gland is a butterfly-shaped gland situated in the front of the neck. It produces hormones which affect metabolism, growth and development, and body temperature. A thyroid biopsy is a procedure in which small samples of tissue or fluid are removed from the thyroid gland or mass and examined under a microscope. This test is done to determine the cause of thyroid problems, such as infection, cancer, or other thyroid problems. °There are 2 ways to obtain samples: °1. Fine needle biopsy. Samples are removed using a thin needle inserted through the skin and into the thyroid gland or mass. °2. Open biopsy. Samples are removed after a cut (incision) is made through the skin. °LET YOUR CAREGIVER KNOW ABOUT:  °· Allergies. °· Medications taken including herbs, eye drops, over-the-counter medications, and creams. °· Use of steroids (by mouth or creams). °· Previous problems with anesthetics or numbing medicine. °· Possibility of pregnancy, if this applies. °· History of blood clots (thrombophlebitis). °· History of bleeding or blood problems. °· Previous surgery. °· Other health problems. °RISKS AND COMPLICATIONS °· Bleeding from the site. The risk of bleeding is higher if you have a bleeding disorder or are taking any blood thinning medications (anticoagulants). °· Infection. °· Injury to structures near the thyroid gland. °BEFORE THE PROCEDURE  °This is a procedure that can be done as an outpatient. Confirm the time that you need to arrive for your procedure. Confirm whether there is a need to fast or withhold any medications. A blood sample may be done to determine your blood clotting time. Medicine may be given to help you relax (sedative). °PROCEDURE °Fine needle biopsy. °You will be awake during the procedure. You may be asked to lie on your back with your head tipped backward to extend your neck. Let your caregiver know if you cannot tolerate the positioning. An area on your neck will be  cleansed. A needle is inserted through the skin of your neck. You may feel a mild discomfort during this procedure. You may be asked to avoid coughing, talking, swallowing, or making sounds during some portions of the procedure. The needle is withdrawn once tissue or fluid samples have been removed. Pressure may be applied to the neck to reduce swelling and ensure that bleeding has stopped. The samples will be sent for examination.  °Open biopsy. °You will be given general anesthesia. You will be asleep during the procedure. An incision is made in your neck. A sample of thyroid tissue or the mass is removed. The tissue sample or mass will be sent for examination. The sample or mass may be examined during the biopsy. If the sample or mass contains cancer cells, some or all of the thyroid gland may be removed. The incision is closed with stitches. °AFTER THE PROCEDURE  °Your recovery will be assessed and monitored. If there are no problems, as an outpatient, you should be able to go home shortly after the procedure. °If you had a fine needle biopsy: °· You may have soreness at the biopsy site for 1 to 2 days. °If you had an open biopsy:  °· You may have soreness at the biopsy site for 3 to 4 days. °· You may have a hoarse voice or sore throat for 1 to 2 days. °Obtaining the Test Results °It is your responsibility to obtain your test results. Do not assume everything is normal if you have not heard from your caregiver or the medical facility. It is important for you to follow up   on all of your test results. °HOME CARE INSTRUCTIONS  °· Keeping your head raised on a pillow when you are lying down may ease biopsy site discomfort. °· Supporting the back of your head and neck with both hands as you sit up from a lying position may ease biopsy site discomfort. °· Only take over-the-counter or prescription medicines for pain, discomfort, or fever as directed by your caregiver. °· Throat lozenges or gargling with warm salt  water may help to soothe a sore throat. °SEEK IMMEDIATE MEDICAL CARE IF:  °· You have severe bleeding from the biopsy site. °· You have difficulty swallowing. °· You have a fever. °· You have increased pain, swelling, redness, or warmth at the biopsy site. °· You notice pus coming from the biopsy site. °· You have swollen glands (lymph nodes) in your neck. °Document Released: 02/27/2007 Document Revised: 08/27/2012 Document Reviewed: 07/25/2013 °ExitCare® Patient Information ©2015 [employer]. This information is not intended to replace advice given to you by your health care provider. Make sure you discuss any questions you have with your health care provider. ° °

## 2014-03-10 ENCOUNTER — Encounter (HOSPITAL_COMMUNITY): Payer: Self-pay

## 2014-03-10 ENCOUNTER — Ambulatory Visit (HOSPITAL_COMMUNITY)
Admission: RE | Admit: 2014-03-10 | Discharge: 2014-03-10 | Disposition: A | Discharge status: Home / Self Care | Payer: PRIVATE HEALTH INSURANCE | Source: Ambulatory Visit | Attending: "Endocrinology | Admitting: "Endocrinology

## 2014-03-10 DIAGNOSIS — E049 Nontoxic goiter, unspecified: Secondary | ICD-10-CM | POA: Insufficient documentation

## 2014-03-10 MED ORDER — LIDOCAINE HCL (PF) 2 % IJ SOLN
INTRAMUSCULAR | Status: AC
  Filled 2014-03-10: qty 10

## 2014-03-10 NOTE — Discharge Instructions (Signed)
Thyroid Biopsy °The thyroid gland is a butterfly-shaped gland situated in the front of the neck. It produces hormones which affect metabolism, growth and development, and body temperature. A thyroid biopsy is a procedure in which small samples of tissue or fluid are removed from the thyroid gland or mass and examined under a microscope. This test is done to determine the cause of thyroid problems, such as infection, cancer, or other thyroid problems. °There are 2 ways to obtain samples: °1. Fine needle biopsy. Samples are removed using a thin needle inserted through the skin and into the thyroid gland or mass. °2. Open biopsy. Samples are removed after a cut (incision) is made through the skin. °LET YOUR CAREGIVER KNOW ABOUT:  °· Allergies. °· Medications taken including herbs, eye drops, over-the-counter medications, and creams. °· Use of steroids (by mouth or creams). °· Previous problems with anesthetics or numbing medicine. °· Possibility of pregnancy, if this applies. °· History of blood clots (thrombophlebitis). °· History of bleeding or blood problems. °· Previous surgery. °· Other health problems. °RISKS AND COMPLICATIONS °· Bleeding from the site. The risk of bleeding is higher if you have a bleeding disorder or are taking any blood thinning medications (anticoagulants). °· Infection. °· Injury to structures near the thyroid gland. °BEFORE THE PROCEDURE  °This is a procedure that can be done as an outpatient. Confirm the time that you need to arrive for your procedure. Confirm whether there is a need to fast or withhold any medications. A blood sample may be done to determine your blood clotting time. Medicine may be given to help you relax (sedative). °PROCEDURE °Fine needle biopsy. °You will be awake during the procedure. You may be asked to lie on your back with your head tipped backward to extend your neck. Let your caregiver know if you cannot tolerate the positioning. An area on your neck will be  cleansed. A needle is inserted through the skin of your neck. You may feel a mild discomfort during this procedure. You may be asked to avoid coughing, talking, swallowing, or making sounds during some portions of the procedure. The needle is withdrawn once tissue or fluid samples have been removed. Pressure may be applied to the neck to reduce swelling and ensure that bleeding has stopped. The samples will be sent for examination.  °Open biopsy. °You will be given general anesthesia. You will be asleep during the procedure. An incision is made in your neck. A sample of thyroid tissue or the mass is removed. The tissue sample or mass will be sent for examination. The sample or mass may be examined during the biopsy. If the sample or mass contains cancer cells, some or all of the thyroid gland may be removed. The incision is closed with stitches. °AFTER THE PROCEDURE  °Your recovery will be assessed and monitored. If there are no problems, as an outpatient, you should be able to go home shortly after the procedure. °If you had a fine needle biopsy: °· You may have soreness at the biopsy site for 1 to 2 days. °If you had an open biopsy:  °· You may have soreness at the biopsy site for 3 to 4 days. °· You may have a hoarse voice or sore throat for 1 to 2 days. °Obtaining the Test Results °It is your responsibility to obtain your test results. Do not assume everything is normal if you have not heard from your caregiver or the medical facility. It is important for you to follow up   on all of your test results. °HOME CARE INSTRUCTIONS  °· Keeping your head raised on a pillow when you are lying down may ease biopsy site discomfort. °· Supporting the back of your head and neck with both hands as you sit up from a lying position may ease biopsy site discomfort. °· Only take over-the-counter or prescription medicines for pain, discomfort, or fever as directed by your caregiver. °· Throat lozenges or gargling with warm salt  water may help to soothe a sore throat. °SEEK IMMEDIATE MEDICAL CARE IF:  °· You have severe bleeding from the biopsy site. °· You have difficulty swallowing. °· You have a fever. °· You have increased pain, swelling, redness, or warmth at the biopsy site. °· You notice pus coming from the biopsy site. °· You have swollen glands (lymph nodes) in your neck. °Document Released: 02/27/2007 Document Revised: 08/27/2012 Document Reviewed: 07/25/2013 °ExitCare® Patient Information ©2015 [employer]. This information is not intended to replace advice given to you by your health care provider. Make sure you discuss any questions you have with your health care provider. ° °

## 2014-03-10 NOTE — Procedures (Signed)
PreOperative Dx: LEFT thyroid mass Postoperative Dx: LEFT thyroid mass Procedure:   US guided FNA of LEFT thyroid nodule Radiologist:  Thornton Papas Anesthesia:  3 ml of 2 lidocaine Specimen:  FNA x 3  EBL:   < 1 ml Complications: None

## 2014-03-10 NOTE — Progress Notes (Signed)
Eliquis stopped 2 days prior to thyroid biopsy.

## 2014-04-28 ENCOUNTER — Encounter (INDEPENDENT_AMBULATORY_CARE_PROVIDER_SITE_OTHER): Payer: Self-pay

## 2014-05-12 ENCOUNTER — Encounter: Payer: Self-pay | Admitting: *Deleted

## 2014-05-21 ENCOUNTER — Telehealth: Payer: Self-pay | Admitting: *Deleted

## 2014-05-21 NOTE — Telephone Encounter (Signed)
Is PCP still managing anticoagulation? If so, this should be done by PCP.

## 2014-05-21 NOTE — Telephone Encounter (Signed)
Patient came by office requesting new note regarding holding his Eliquis.  See previous letter done on 05/12/2014.    Stated he was going to Dr. Armandina Gemma [facility]) - this MD is requesting him be off Eliquis 4-5 days & needs new note.

## 2014-05-22 NOTE — Telephone Encounter (Signed)
Patient notified.  PMD is managing.  Will forward message to PMD for him to make final decision on holding Eliquis.

## 2014-05-22 NOTE — Telephone Encounter (Signed)
Office manager was made aware of below as patient was not happy with this response.  She will be responding to patient with reply today.

## 2014-06-04 ENCOUNTER — Ambulatory Visit (INDEPENDENT_AMBULATORY_CARE_PROVIDER_SITE_OTHER): Payer: Self-pay | Admitting: Surgery

## 2014-06-06 ENCOUNTER — Other Ambulatory Visit (HOSPITAL_COMMUNITY): Payer: Self-pay

## 2014-06-09 ENCOUNTER — Encounter (HOSPITAL_COMMUNITY): Payer: Self-pay | Admitting: Pharmacy Technician

## 2014-06-10 ENCOUNTER — Encounter (HOSPITAL_COMMUNITY)
Admission: RE | Admit: 2014-06-10 | Discharge: 2014-06-10 | Disposition: A | Discharge status: Home / Self Care | Payer: Medicare Other | Source: Ambulatory Visit | Attending: Anesthesiology | Admitting: Anesthesiology

## 2014-06-10 ENCOUNTER — Encounter (HOSPITAL_COMMUNITY)
Admission: RE | Admit: 2014-06-10 | Discharge: 2014-06-10 | Disposition: A | Discharge status: Home / Self Care | Payer: Medicare Other | Source: Ambulatory Visit | Attending: Surgery | Admitting: Surgery

## 2014-06-10 ENCOUNTER — Encounter (HOSPITAL_COMMUNITY): Payer: Self-pay

## 2014-06-10 DIAGNOSIS — Z8673 Personal history of transient ischemic attack (TIA), and cerebral infarction without residual deficits: Secondary | ICD-10-CM | POA: Diagnosis not present

## 2014-06-10 DIAGNOSIS — Z950 Presence of cardiac pacemaker: Secondary | ICD-10-CM | POA: Diagnosis not present

## 2014-06-10 DIAGNOSIS — Z885 Allergy status to narcotic agent status: Secondary | ICD-10-CM | POA: Diagnosis not present

## 2014-06-10 DIAGNOSIS — M1990 Unspecified osteoarthritis, unspecified site: Secondary | ICD-10-CM | POA: Diagnosis not present

## 2014-06-10 DIAGNOSIS — Z01818 Encounter for other preprocedural examination: Secondary | ICD-10-CM

## 2014-06-10 DIAGNOSIS — E119 Type 2 diabetes mellitus without complications: Secondary | ICD-10-CM | POA: Diagnosis not present

## 2014-06-10 DIAGNOSIS — I10 Essential (primary) hypertension: Secondary | ICD-10-CM | POA: Diagnosis not present

## 2014-06-10 DIAGNOSIS — D34 Benign neoplasm of thyroid gland: Secondary | ICD-10-CM | POA: Diagnosis not present

## 2014-06-10 DIAGNOSIS — D440 Neoplasm of uncertain behavior of thyroid gland: Secondary | ICD-10-CM | POA: Diagnosis present

## 2014-06-10 DIAGNOSIS — Z7982 Long term (current) use of aspirin: Secondary | ICD-10-CM | POA: Diagnosis not present

## 2014-06-10 DIAGNOSIS — E041 Nontoxic single thyroid nodule: Secondary | ICD-10-CM | POA: Diagnosis not present

## 2014-06-10 DIAGNOSIS — I4891 Unspecified atrial fibrillation: Secondary | ICD-10-CM | POA: Diagnosis not present

## 2014-06-10 DIAGNOSIS — Z87891 Personal history of nicotine dependence: Secondary | ICD-10-CM | POA: Diagnosis not present

## 2014-06-10 DIAGNOSIS — I2510 Atherosclerotic heart disease of native coronary artery without angina pectoris: Secondary | ICD-10-CM | POA: Diagnosis not present

## 2014-06-10 DIAGNOSIS — K219 Gastro-esophageal reflux disease without esophagitis: Secondary | ICD-10-CM | POA: Diagnosis not present

## 2014-06-10 DIAGNOSIS — Z7901 Long term (current) use of anticoagulants: Secondary | ICD-10-CM | POA: Diagnosis not present

## 2014-06-10 HISTORY — DX: Gastro-esophageal reflux disease without esophagitis: K21.9

## 2014-06-10 HISTORY — DX: Cerebral infarction, unspecified: I63.9

## 2014-06-10 LAB — BASIC METABOLIC PANEL
Anion gap: 9 (ref 5–15)
BUN: 20 mg/dL (ref 6–23)
CHLORIDE: 105 mmol/L (ref 96–112)
CO2: 23 mmol/L (ref 19–32)
CREATININE: 0.91 mg/dL (ref 0.50–1.35)
Calcium: 9.7 mg/dL (ref 8.4–10.5)
GFR, EST NON AFRICAN AMERICAN: 82 mL/min — AB (ref >90)
GLUCOSE: 217 mg/dL — AB (ref 70–99)
Potassium: 4 mmol/L (ref 3.5–5.1)
SODIUM: 137 mmol/L (ref 135–145)

## 2014-06-10 LAB — CBC
HEMATOCRIT: 38.4 % — AB (ref 39.0–52.0)
HEMOGLOBIN: 13.8 g/dL (ref 13.0–17.0)
MCH: 29.9 pg (ref 26.0–34.0)
MCHC: 35.9 g/dL (ref 30.0–36.0)
MCV: 83.3 fL (ref 78.0–100.0)
Platelets: 185 10*3/uL (ref 150–400)
RBC: 4.61 MIL/uL (ref 4.22–5.81)
RDW: 14 % (ref 11.5–15.5)
WBC: 7.4 10*3/uL (ref 4.0–10.5)

## 2014-06-10 LAB — PROTIME-INR
INR: 1.14 (ref 0.00–1.49)
Prothrombin Time: 14.8 seconds (ref 11.6–15.2)

## 2014-06-10 LAB — HEMOGLOBIN A1C
Hgb A1c MFr Bld: 8 % — ABNORMAL HIGH (ref <5.7)
Mean Plasma Glucose: 183 mg/dL — ABNORMAL HIGH (ref <117)

## 2014-06-10 LAB — APTT: aPTT: 29 seconds (ref 24–37)

## 2014-06-10 NOTE — Progress Notes (Signed)
Primary - dr. Quintin Alto Cardiologist - Nicki [place] ([place]) Stress aug 2015, echo sept 2015, ekg in epic from 01/2014 Stopped coumadin 1/22

## 2014-06-10 NOTE — Progress Notes (Signed)
Quick Note:  These results are acceptable for scheduled surgery.  Jahvon Gosline M. Tasnim Balentine, MD, FACS [facility] Office: [phone]   ______ 

## 2014-06-10 NOTE — Pre-Procedure Instructions (Signed)
Clifford [name]  06/10/2014   Your procedure is scheduled on:  Thursday, January 28th  Report to [facility] Admitting at 530 AM.  Call this number if you have problems the morning of surgery: [phone]   Remember:   Do not eat food or drink liquids after midnight.   Take these medicines the morning of surgery with A SIP OF WATER: zantac if needed, hydrocodone if needed  Stop taking aspirin, OTC vitamins/herbal medications, NSAIDS (ibuprofen, advil, motrin) as of today. No Diabetes medication on morning of surgery.  Stop coumadin per MD instructions    Do not wear jewelry  Do not wear lotions, powders, or perfumes,deodorant.  Do not shave 48 hours prior to surgery. Men may shave face and neck.  Do not bring valuables to the hospital.  [facility] is not responsible  for any belongings or valuables.               Contacts, dentures or bridgework may not be worn into surgery.  Leave suitcase in the car. After surgery it may be brought to your room.  For patients admitted to the hospital, discharge time is determined by your  treatment team.         Please read over the following fact sheets that you were given: Pain Booklet, Coughing and Deep Breathing and Surgical Site Infection Prevention  [place] - Preparing for Surgery  Before surgery, you can play an important role.  Because skin is not sterile, your skin needs to be as free of germs as possible.  You can reduce the number of germs on you skin by washing with CHG (chlorahexidine gluconate) soap before surgery.  CHG is an antiseptic cleaner which kills germs and bonds with the skin to continue killing germs even after washing.  Please DO NOT use if you have an allergy to CHG or antibacterial soaps.  If your skin becomes reddened/irritated stop using the CHG and inform your nurse when you arrive at Short Stay.  Do not shave (including legs and underarms) for at least 48 hours prior to the first CHG shower.  You may  shave your face.  Please follow these instructions carefully:   1.  Shower with CHG Soap the night before surgery and the morning of Surgery.  2.  If you choose to wash your hair, wash your hair first as usual with your normal shampoo.  3.  After you shampoo, rinse your hair and body thoroughly to remove the shampoo.  4.  Use CHG as you would any other liquid soap.  You can apply CHG directly to the skin and wash gently with scrungie or a clean washcloth.  5.  Apply the CHG Soap to your body ONLY FROM THE NECK DOWN.  Do not use on open wounds or open sores.  Avoid contact with your eyes, ears, mouth and genitals (private parts).  Wash genitals (private parts) with your normal soap.  6.  Wash thoroughly, paying special attention to the area where your surgery will be performed.  7.  Thoroughly rinse your body with warm water from the neck down.  8.  DO NOT shower/wash with your normal soap after using and rinsing off the CHG Soap.  9.  Pat yourself dry with a clean towel.            10.  Wear clean pajamas.            11.  Place clean sheets on your bed  the night of your first shower and do not sleep with pets.  Day of Surgery  Do not apply any lotions/deoderants the morning of surgery.  Please wear clean clothes to the hospital/surgery center.

## 2014-06-11 ENCOUNTER — Encounter (HOSPITAL_COMMUNITY): Payer: Self-pay | Admitting: Surgery

## 2014-06-11 DIAGNOSIS — D440 Neoplasm of uncertain behavior of thyroid gland: Secondary | ICD-10-CM | POA: Diagnosis present

## 2014-06-11 LAB — GLUCOSE, CAPILLARY: Glucose-Capillary: 196 mg/dL — ABNORMAL HIGH (ref 70–99)

## 2014-06-11 MED ORDER — CEFAZOLIN SODIUM-DEXTROSE 2-3 GM-% IV SOLR
2.0000 g | INTRAVENOUS | Status: AC
  Administered 2014-06-12: 2 g via INTRAVENOUS
  Filled 2014-06-11: qty 50

## 2014-06-11 NOTE — H&P (Signed)
General Surgery [facility]  Wynelle Beckmann DOB: 07-Jan-1941 Married / Language: Undefined / Race: Undefined Male  History of Present Illness  Patient words: eval thyroid cancer.  The patient is a 74 year old male who presents with a thyroid nodule. Patient is referred by Dr. Aviva Signs for evaluation of left thyroid nodule with significant cytologic atypia. Patient had first noted this on self examination. It was then noted incidentally on a radiographic study. Thyroid ultrasound performed January 27, 2014 showed a normal sized right thyroid lobe measuring 4.2 cm and containing multiple subcentimeter nodules. Left thyroid lobe was larger at 6.1 cm and contained a dominant nodule measuring 3.9 cm. Patient was evaluated by Dr. Dorris Fetch and ultrasound-guided fine-needle aspiration biopsy was obtained. 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 Due to significant comorbidities including history of recent cerebrovascular accident, atrial fibrillation, pacemaker, and chronic anticoagulation, the patient is referred for surgical consultation in order to have his procedure at a tertiary care institution such as [facility]. Patient denies any prior thyroid disease. He has never been on thyroid medication. He has had no prior head or neck surgery. There is no family history of thyroid disease. There is no family history of other endocrinopathy. Patient denies dysphagia. His wife states that he does frequently clear his throat. He has not been short of breath. He denies tremors.   Other Problems Arthritis Back Pain Cerebrovascular Accident Cholelithiasis Diabetes Mellitus Gastroesophageal Reflux Disease High blood pressure  Past Surgical History  No pertinent past surgical history Pacemaker Heart Disease stents Shoulder Surgery - Both  Diagnostic Studies History  Colonoscopy  never  Allergies  Demerol *ANALGESICS - OPIOID*  Medication History Apixaban (Oral) Specific dose unknown - Active. Aspirin EC (Oral) Specific dose unknown - Active. Folic Acid (Oral) Specific dose unknown - Active. GlipiZIDE XL (Oral) Specific dose unknown - Active. HydroDiuril (Oral) Specific dose unknown - Active. Norco (Oral) Specific dose unknown - Active. Insulin Glargine (Subcutaneous) Specific dose unknown - Active. Lisinopril (Oral) Specific dose unknown - Active. Lovastatin ER (Oral) Specific dose unknown - Active. MetFORMIN HCl (Oral) Specific dose unknown - Active. Methotrexate (Anti-Rheumatic) (Oral) Specific dose unknown - Active. Nitrostat (Sublingual) Specific dose unknown - Active. Ranitidine Acid Reducer (Oral) Specific dose unknown - Active.  Social History  Alcohol use Occasional alcohol use. Caffeine use Carbonated beverages, Coffee, Tea. No drug use Tobacco use Former smoker.  Family History Alcohol Abuse Brother. Arthritis Sister. Colon Cancer Brother. Melanoma Daughter.  Review of Systems General Present- Fatigue. Not Present- Appetite Loss, Chills, Fever, Night Sweats, Weight Gain and Weight Loss. Skin Present- Dryness. Not Present- Change in Wart/Mole, Hives, Jaundice, New Lesions, Non-Healing Wounds, Rash and Ulcer. HEENT Present- Hoarseness and Wears glasses/contact lenses. Not Present- Earache, Hearing Loss, Nose Bleed, Oral Ulcers, Ringing in the Ears, Seasonal Allergies, Sinus Pain, Sore Throat, Visual Disturbances and Yellow Eyes. Respiratory Present- Snoring. Not Present- Bloody sputum, Chronic Cough, Difficulty Breathing and Wheezing. Breast Not Present- Breast Mass, Breast Pain, Nipple Discharge and Skin Changes. Cardiovascular Present- Swelling of Extremities. Not Present- Chest Pain, Difficulty Breathing Lying Down, Leg Cramps, Palpitations, Rapid Heart Rate and Shortness of Breath. Gastrointestinal Present- Difficulty  Swallowing. Not Present- Abdominal Pain, Bloating, Bloody Stool, Change in Bowel Habits, Chronic diarrhea, Constipation, Excessive gas, Gets full quickly at meals, Hemorrhoids, Indigestion, Nausea, Rectal Pain and Vomiting. Male Genitourinary Not Present- Blood in Urine, Change in Urinary Stream, Frequency, Impotence, Nocturia, Painful Urination,  Urgency and Urine Leakage.   Vitals 04/28/2014 1:49 PM Weight: 204 lb Height: 71in Body Surface Area: 2.15 m Body Mass Index: 28.45 kg/m Temp.: 97.72F(Oral)  Pulse: 80 (Regular)  Resp.: 18 (Unlabored)  BP: 118/72 (Sitting, Left Arm, Standard)    Physical Exam  General - appears comfortable, no distress; not diaphorectic  HEENT - normocephalic; sclerae clear, gaze conjugate; mucous membranes moist, dentition poor; voice normal  Neck - symmetric on limited extension; no palpable anterior or posterior cervical adenopathy; palpation in the right thyroid bed shows no dominant nor discrete mass; palpation in the left thyroid bed shows a firm dominant mass occupying a large portion of the left thyroid lobe and extending slightly beneath the left clavicle  Chest - clear bilaterally with rhonchi, rales, or wheeze  Cor - irregular rhythm with normal rate; no significant murmur  Ext - non-tender with trace edema  Neuro - grossly intact; no tremor    Assessment & Plan  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 He has bilateral thyroid nodules. He has borderline hyperthyroidism.  I agree with the recommendation of his endocrinologist and of Dr. Aviva Signs for total thyroidectomy. I have discussed this with the patient and his wife today at length. I provided them with written literature to review at home. We discussed the procedure, the hospital stay, and the postoperative recovery. We  discussed potential complications including the potential for injury to recurrent laryngeal nerve and parathyroid glands. We discussed the location of the surgical incision. We discussed the need for thyroid hormone replacement. We discussed the potential need for radioactive iodine treatment. They understand and wish to proceed.  Patient will require evaluation and clearance by cardiology. We will also ask them to make recommendations for perioperative management of his anticoagulation.  The risks and benefits of the procedure have been discussed at length with the patient. The patient understands the proposed procedure, potential alternative treatments, and the course of recovery to be expected. All of the patient's questions have been answered at this time. The patient wishes to proceed with surgery.  Earnstine Regal, MD, [facility] Office: [phone]

## 2014-06-11 NOTE — Progress Notes (Signed)
Quick Note:  Pre-operative chest x-ray is acceptable for scheduled surgery.  Cynara Tatham M. Willamae Demby, MD, FACS [facility] Office: [phone]   ______ 

## 2014-06-12 ENCOUNTER — Ambulatory Visit (HOSPITAL_COMMUNITY): Payer: Medicare Other | Admitting: Certified Registered Nurse Anesthetist

## 2014-06-12 ENCOUNTER — Encounter (HOSPITAL_COMMUNITY)
Admission: RE | Disposition: A | Discharge status: Home / Self Care | Payer: Self-pay | Source: Ambulatory Visit | Attending: Surgery

## 2014-06-12 ENCOUNTER — Observation Stay (HOSPITAL_COMMUNITY)
Admission: RE | Admit: 2014-06-12 | Discharge: 2014-06-13 | Disposition: A | Discharge status: Home / Self Care | Payer: Medicare Other | Source: Ambulatory Visit | Attending: Surgery | Admitting: Surgery

## 2014-06-12 ENCOUNTER — Encounter (HOSPITAL_COMMUNITY): Payer: Self-pay | Admitting: Certified Registered Nurse Anesthetist

## 2014-06-12 DIAGNOSIS — K219 Gastro-esophageal reflux disease without esophagitis: Secondary | ICD-10-CM | POA: Insufficient documentation

## 2014-06-12 DIAGNOSIS — Z7901 Long term (current) use of anticoagulants: Secondary | ICD-10-CM | POA: Insufficient documentation

## 2014-06-12 DIAGNOSIS — I2510 Atherosclerotic heart disease of native coronary artery without angina pectoris: Secondary | ICD-10-CM | POA: Insufficient documentation

## 2014-06-12 DIAGNOSIS — D34 Benign neoplasm of thyroid gland: Principal | ICD-10-CM | POA: Insufficient documentation

## 2014-06-12 DIAGNOSIS — I4891 Unspecified atrial fibrillation: Secondary | ICD-10-CM | POA: Insufficient documentation

## 2014-06-12 DIAGNOSIS — Z7982 Long term (current) use of aspirin: Secondary | ICD-10-CM | POA: Insufficient documentation

## 2014-06-12 DIAGNOSIS — M1990 Unspecified osteoarthritis, unspecified site: Secondary | ICD-10-CM | POA: Insufficient documentation

## 2014-06-12 DIAGNOSIS — E041 Nontoxic single thyroid nodule: Secondary | ICD-10-CM | POA: Insufficient documentation

## 2014-06-12 DIAGNOSIS — I10 Essential (primary) hypertension: Secondary | ICD-10-CM | POA: Insufficient documentation

## 2014-06-12 DIAGNOSIS — Z950 Presence of cardiac pacemaker: Secondary | ICD-10-CM | POA: Diagnosis not present

## 2014-06-12 DIAGNOSIS — E119 Type 2 diabetes mellitus without complications: Secondary | ICD-10-CM | POA: Insufficient documentation

## 2014-06-12 DIAGNOSIS — Z885 Allergy status to narcotic agent status: Secondary | ICD-10-CM | POA: Insufficient documentation

## 2014-06-12 DIAGNOSIS — D440 Neoplasm of uncertain behavior of thyroid gland: Secondary | ICD-10-CM | POA: Diagnosis present

## 2014-06-12 DIAGNOSIS — Z8673 Personal history of transient ischemic attack (TIA), and cerebral infarction without residual deficits: Secondary | ICD-10-CM | POA: Insufficient documentation

## 2014-06-12 DIAGNOSIS — Z87891 Personal history of nicotine dependence: Secondary | ICD-10-CM | POA: Insufficient documentation

## 2014-06-12 HISTORY — PX: THYROIDECTOMY: SHX17

## 2014-06-12 LAB — GLUCOSE, CAPILLARY
GLUCOSE-CAPILLARY: 160 mg/dL — AB (ref 70–99)
GLUCOSE-CAPILLARY: 125 mg/dL — AB (ref 70–99)
Glucose-Capillary: 224 mg/dL — ABNORMAL HIGH (ref 70–99)
Glucose-Capillary: 255 mg/dL — ABNORMAL HIGH (ref 70–99)
Glucose-Capillary: 222 mg/dL — ABNORMAL HIGH (ref 70–99)

## 2014-06-12 SURGERY — THYROIDECTOMY
Anesthesia: General | Site: Neck

## 2014-06-12 MED ORDER — INSULIN ASPART 100 UNIT/ML ~~LOC~~ SOLN
0.0000 [IU] | Freq: Three times a day (TID) | SUBCUTANEOUS | Status: DC
  Administered 2014-06-12: 8 [IU] via SUBCUTANEOUS
  Administered 2014-06-13: 3 [IU] via SUBCUTANEOUS

## 2014-06-12 MED ORDER — HYDROMORPHONE HCL 1 MG/ML IJ SOLN
1.0000 mg | INTRAMUSCULAR | Status: DC | PRN
  Administered 2014-06-12: 1 mg via INTRAVENOUS
  Filled 2014-06-12: qty 1

## 2014-06-12 MED ORDER — INSULIN GLARGINE 100 UNIT/ML ~~LOC~~ SOLN
50.0000 [IU] | Freq: Every day | SUBCUTANEOUS | Status: DC
  Administered 2014-06-12: 50 [IU] via SUBCUTANEOUS
  Filled 2014-06-12 (×2): qty 0.5

## 2014-06-12 MED ORDER — ONDANSETRON HCL 4 MG PO TABS: 4.0000 mg | ORAL_TABLET | Freq: Four times a day (QID) | ORAL | Status: DC | PRN

## 2014-06-12 MED ORDER — LIDOCAINE HCL (CARDIAC) 20 MG/ML IV SOLN
INTRAVENOUS | Status: AC
  Filled 2014-06-12: qty 10

## 2014-06-12 MED ORDER — PROPOFOL 10 MG/ML IV BOLUS
INTRAVENOUS | Status: AC
  Filled 2014-06-12: qty 20

## 2014-06-12 MED ORDER — MIDAZOLAM HCL 5 MG/5ML IJ SOLN
INTRAMUSCULAR | Status: DC | PRN
  Administered 2014-06-12: 2 mg via INTRAVENOUS

## 2014-06-12 MED ORDER — NEOSTIGMINE METHYLSULFATE 10 MG/10ML IV SOLN
INTRAVENOUS | Status: AC
  Filled 2014-06-12: qty 1

## 2014-06-12 MED ORDER — 0.9 % SODIUM CHLORIDE (POUR BTL) OPTIME
TOPICAL | Status: DC | PRN
  Administered 2014-06-12: 1000 mL

## 2014-06-12 MED ORDER — ROCURONIUM BROMIDE 50 MG/5ML IV SOLN
INTRAVENOUS | Status: AC
  Filled 2014-06-12: qty 1

## 2014-06-12 MED ORDER — MEPERIDINE HCL 25 MG/ML IJ SOLN: 6.2500 mg | INTRAMUSCULAR | Status: DC | PRN

## 2014-06-12 MED ORDER — ONDANSETRON HCL 4 MG/2ML IJ SOLN
INTRAMUSCULAR | Status: AC
  Filled 2014-06-12: qty 2

## 2014-06-12 MED ORDER — GLIPIZIDE 10 MG PO TABS
10.0000 mg | ORAL_TABLET | Freq: Every day | ORAL | Status: DC
  Administered 2014-06-13: 10 mg via ORAL
  Filled 2014-06-12 (×2): qty 1

## 2014-06-12 MED ORDER — MIDAZOLAM HCL 2 MG/2ML IJ SOLN: 0.5000 mg | Freq: Once | INTRAMUSCULAR | Status: DC | PRN

## 2014-06-12 MED ORDER — PROMETHAZINE HCL 25 MG/ML IJ SOLN: 6.2500 mg | INTRAMUSCULAR | Status: DC | PRN

## 2014-06-12 MED ORDER — CALCIUM CARBONATE-VITAMIN D 500-200 MG-UNIT PO TABS: 2.0000 | ORAL_TABLET | Freq: Three times a day (TID) | ORAL | Status: DC

## 2014-06-12 MED ORDER — HYDROMORPHONE HCL 1 MG/ML IJ SOLN
INTRAMUSCULAR | Status: AC
  Filled 2014-06-12: qty 1

## 2014-06-12 MED ORDER — ACETAMINOPHEN 325 MG PO TABS
650.0000 mg | ORAL_TABLET | ORAL | Status: DC | PRN
  Administered 2014-06-12: 650 mg via ORAL
  Filled 2014-06-12: qty 2

## 2014-06-12 MED ORDER — HYDROCHLOROTHIAZIDE 25 MG PO TABS
25.0000 mg | ORAL_TABLET | Freq: Every day | ORAL | Status: DC
  Administered 2014-06-12 – 2014-06-13 (×2): 25 mg via ORAL
  Filled 2014-06-12 (×2): qty 1

## 2014-06-12 MED ORDER — LIDOCAINE HCL (CARDIAC) 20 MG/ML IV SOLN
INTRAVENOUS | Status: DC | PRN
  Administered 2014-06-12: 50 mg via INTRAVENOUS
  Administered 2014-06-12: 50 mg via INTRATRACHEAL

## 2014-06-12 MED ORDER — HYDROCODONE-ACETAMINOPHEN 5-325 MG PO TABS
1.0000 | ORAL_TABLET | ORAL | Status: DC | PRN
  Administered 2014-06-12 – 2014-06-13 (×2): 2 via ORAL
  Filled 2014-06-12 (×2): qty 2

## 2014-06-12 MED ORDER — GLYCOPYRROLATE 0.2 MG/ML IJ SOLN
INTRAMUSCULAR | Status: DC | PRN
  Administered 2014-06-12: 0.6 mg via INTRAVENOUS

## 2014-06-12 MED ORDER — PHENYLEPHRINE HCL 10 MG/ML IJ SOLN
INTRAMUSCULAR | Status: DC | PRN
  Administered 2014-06-12 (×4): 40 ug via INTRAVENOUS

## 2014-06-12 MED ORDER — LISINOPRIL 20 MG PO TABS
20.0000 mg | ORAL_TABLET | Freq: Every day | ORAL | Status: DC
  Administered 2014-06-12 – 2014-06-13 (×2): 20 mg via ORAL
  Filled 2014-06-12 (×2): qty 1

## 2014-06-12 MED ORDER — ONDANSETRON HCL 4 MG/2ML IJ SOLN
INTRAMUSCULAR | Status: DC | PRN
Start: 2014-06-12 — End: 2014-06-12
  Administered 2014-06-12: 4 mg via INTRAVENOUS

## 2014-06-12 MED ORDER — HYDROMORPHONE HCL 1 MG/ML IJ SOLN
0.2500 mg | INTRAMUSCULAR | Status: DC | PRN
  Administered 2014-06-12 (×2): 0.5 mg via INTRAVENOUS

## 2014-06-12 MED ORDER — PHENYLEPHRINE 40 MCG/ML (10ML) SYRINGE FOR IV PUSH (FOR BLOOD PRESSURE SUPPORT)
PREFILLED_SYRINGE | INTRAVENOUS | Status: AC
  Filled 2014-06-12: qty 10

## 2014-06-12 MED ORDER — GLYCOPYRROLATE 0.2 MG/ML IJ SOLN
INTRAMUSCULAR | Status: AC
  Filled 2014-06-12: qty 3

## 2014-06-12 MED ORDER — METFORMIN HCL 500 MG PO TABS
1000.0000 mg | ORAL_TABLET | Freq: Two times a day (BID) | ORAL | Status: DC
  Administered 2014-06-12 – 2014-06-13 (×2): 1000 mg via ORAL
  Filled 2014-06-12 (×5): qty 2

## 2014-06-12 MED ORDER — MIDAZOLAM HCL 2 MG/2ML IJ SOLN
INTRAMUSCULAR | Status: AC
  Filled 2014-06-12: qty 2

## 2014-06-12 MED ORDER — KCL IN DEXTROSE-NACL 20-5-0.45 MEQ/L-%-% IV SOLN
INTRAVENOUS | Status: DC
Start: 2014-06-12 — End: 2014-06-13
  Administered 2014-06-12: 16:00:00 via INTRAVENOUS
  Filled 2014-06-12 (×2): qty 1000

## 2014-06-12 MED ORDER — SYNTHROID 100 MCG PO TABS: 100.0000 ug | ORAL_TABLET | Freq: Every day | ORAL | Status: DC

## 2014-06-12 MED ORDER — NITROGLYCERIN 0.4 MG SL SUBL: 0.4000 mg | SUBLINGUAL_TABLET | SUBLINGUAL | Status: DC | PRN

## 2014-06-12 MED ORDER — ARTIFICIAL TEARS OP OINT
TOPICAL_OINTMENT | OPHTHALMIC | Status: AC
  Filled 2014-06-12: qty 3.5

## 2014-06-12 MED ORDER — EPHEDRINE SULFATE 50 MG/ML IJ SOLN
INTRAMUSCULAR | Status: AC
  Filled 2014-06-12: qty 1

## 2014-06-12 MED ORDER — SUCCINYLCHOLINE CHLORIDE 20 MG/ML IJ SOLN
INTRAMUSCULAR | Status: AC
  Filled 2014-06-12: qty 1

## 2014-06-12 MED ORDER — LACTATED RINGERS IV SOLN
INTRAVENOUS | Status: DC | PRN
  Administered 2014-06-12 (×2): via INTRAVENOUS

## 2014-06-12 MED ORDER — NEOSTIGMINE METHYLSULFATE 10 MG/10ML IV SOLN
INTRAVENOUS | Status: DC | PRN
  Administered 2014-06-12: 4 mg via INTRAVENOUS

## 2014-06-12 MED ORDER — FENTANYL CITRATE 0.05 MG/ML IJ SOLN
INTRAMUSCULAR | Status: DC | PRN
  Administered 2014-06-12 (×5): 50 ug via INTRAVENOUS

## 2014-06-12 MED ORDER — CALCIUM CARBONATE 1250 (500 CA) MG PO TABS
2.0000 | ORAL_TABLET | Freq: Three times a day (TID) | ORAL | Status: DC
  Administered 2014-06-13: 1000 mg via ORAL
  Filled 2014-06-12 (×5): qty 2

## 2014-06-12 MED ORDER — FENTANYL CITRATE 0.05 MG/ML IJ SOLN
INTRAMUSCULAR | Status: AC
  Filled 2014-06-12: qty 5

## 2014-06-12 MED ORDER — PROPOFOL 10 MG/ML IV BOLUS
INTRAVENOUS | Status: DC | PRN
  Administered 2014-06-12: 150 mg via INTRAVENOUS

## 2014-06-12 MED ORDER — ONDANSETRON HCL 4 MG/2ML IJ SOLN
4.0000 mg | Freq: Four times a day (QID) | INTRAMUSCULAR | Status: DC | PRN
  Administered 2014-06-12: 4 mg via INTRAVENOUS
  Filled 2014-06-12: qty 2

## 2014-06-12 MED ORDER — STERILE WATER FOR INJECTION IJ SOLN
INTRAMUSCULAR | Status: AC
  Filled 2014-06-12: qty 10

## 2014-06-12 MED ORDER — ROCURONIUM BROMIDE 100 MG/10ML IV SOLN
INTRAVENOUS | Status: DC | PRN
  Administered 2014-06-12: 40 mg via INTRAVENOUS

## 2014-06-12 MED ORDER — OXYCODONE HCL 5 MG PO TABS: 5.0000 mg | ORAL_TABLET | ORAL | Status: DC | PRN

## 2014-06-12 SURGICAL SUPPLY — 54 items
APL SKNCLS STERI-STRIP NONHPOA (GAUZE/BANDAGES/DRESSINGS) ×1
ATTRACTOMAT 16X20 MAGNETIC DRP (DRAPES) ×3 IMPLANT
BENZOIN TINCTURE PRP APPL 2/3 (GAUZE/BANDAGES/DRESSINGS) ×3 IMPLANT
BLADE SURG 10 STRL SS (BLADE) ×3 IMPLANT
BLADE SURG 15 STRL LF DISP TIS (BLADE) ×1 IMPLANT
BLADE SURG 15 STRL SS (BLADE) ×3
BLADE SURG ROTATE 9660 (MISCELLANEOUS) ×2 IMPLANT
CANISTER SUCTION 2500CC (MISCELLANEOUS) ×3 IMPLANT
CHLORAPREP W/TINT 10.5 ML (MISCELLANEOUS) ×3 IMPLANT
CLIP TI MEDIUM 24 (CLIP) ×3 IMPLANT
CLIP TI WIDE RED SMALL 24 (CLIP) ×3 IMPLANT
CLIP TI WIDE RED SMALL 6 (CLIP) ×3 IMPLANT
CLOSURE WOUND 1/2 X4 (GAUZE/BANDAGES/DRESSINGS) ×1
COVER SURGICAL LIGHT HANDLE (MISCELLANEOUS) ×3 IMPLANT
CRADLE DONUT ADULT HEAD (MISCELLANEOUS) ×3 IMPLANT
DRAPE PED LAPAROTOMY (DRAPES) ×3 IMPLANT
DRAPE UTILITY XL STRL (DRAPES) ×6 IMPLANT
ELECT CAUTERY BLADE 6.4 (BLADE) ×3 IMPLANT
ELECT REM PT RETURN 9FT ADLT (ELECTROSURGICAL) ×3
ELECTRODE REM PT RTRN 9FT ADLT (ELECTROSURGICAL) ×1 IMPLANT
GAUZE SPONGE 4X4 12PLY STRL (GAUZE/BANDAGES/DRESSINGS) ×3 IMPLANT
GAUZE SPONGE 4X4 16PLY XRAY LF (GAUZE/BANDAGES/DRESSINGS) ×5 IMPLANT
GLOVE BIO SURGEON STRL SZ7 (GLOVE) ×2 IMPLANT
GLOVE BIOGEL PI IND STRL 7.0 (GLOVE) IMPLANT
GLOVE BIOGEL PI IND STRL 7.5 (GLOVE) ×1 IMPLANT
GLOVE BIOGEL PI INDICATOR 7.0 (GLOVE) ×4
GLOVE BIOGEL PI INDICATOR 7.5 (GLOVE) ×2
GLOVE SURG ORTHO 8.0 STRL STRW (GLOVE) ×3 IMPLANT
GLOVE SURG SS PI 7.0 STRL IVOR (GLOVE) ×3 IMPLANT
GOWN STRL REUS W/ TWL LRG LVL3 (GOWN DISPOSABLE) ×2 IMPLANT
GOWN STRL REUS W/ TWL XL LVL3 (GOWN DISPOSABLE) ×1 IMPLANT
GOWN STRL REUS W/TWL LRG LVL3 (GOWN DISPOSABLE) ×6
GOWN STRL REUS W/TWL XL LVL3 (GOWN DISPOSABLE) ×3
HEMOSTAT SURGICEL 2X4 FIBR (HEMOSTASIS) ×3 IMPLANT
KIT BASIN OR (CUSTOM PROCEDURE TRAY) ×3 IMPLANT
KIT ROOM TURNOVER OR (KITS) ×3 IMPLANT
LIQUID BAND (GAUZE/BANDAGES/DRESSINGS) ×3 IMPLANT
NS IRRIG 1000ML POUR BTL (IV SOLUTION) ×3 IMPLANT
PACK GENERAL/GYN (CUSTOM PROCEDURE TRAY) ×3 IMPLANT
PACK SURGICAL SETUP 50X90 (CUSTOM PROCEDURE TRAY) ×3 IMPLANT
PAD ARMBOARD 7.5X6 YLW CONV (MISCELLANEOUS) ×3 IMPLANT
SHEARS HARMONIC 9CM CVD (BLADE) ×3 IMPLANT
SPECIMEN JAR MEDIUM (MISCELLANEOUS) IMPLANT
SPONGE INTESTINAL PEANUT (DISPOSABLE) ×3 IMPLANT
STRIP CLOSURE SKIN 1/2X4 (GAUZE/BANDAGES/DRESSINGS) ×2 IMPLANT
SUT MNCRL AB 4-0 PS2 18 (SUTURE) ×3 IMPLANT
SUT SILK 2 0 (SUTURE) ×3
SUT SILK 2-0 18XBRD TIE 12 (SUTURE) ×1 IMPLANT
SUT VIC AB 3-0 SH 18 (SUTURE) ×6 IMPLANT
SYR BULB 3OZ (MISCELLANEOUS) ×3 IMPLANT
TOWEL OR 17X24 6PK STRL BLUE (TOWEL DISPOSABLE) ×3 IMPLANT
TOWEL OR 17X26 10 PK STRL BLUE (TOWEL DISPOSABLE) ×3 IMPLANT
TUBE CONNECTING 12'X1/4 (SUCTIONS) ×1
TUBE CONNECTING 12X1/4 (SUCTIONS) ×2 IMPLANT

## 2014-06-12 NOTE — Progress Notes (Signed)
Pt shaved this am and then used the CHG and now has a rash on neck .  No other rash on body.  CHG wipes held.

## 2014-06-12 NOTE — Brief Op Note (Signed)
06/12/2014  9:46 AM  PATIENT:  Wynelle Beckmann  74 y.o. male  PRE-OPERATIVE DIAGNOSIS:  THYROID NEOPLASM OF UNCERTAIN BEHAVIOR  POST-OPERATIVE DIAGNOSIS:  THYROID NEOPLASM OF UNCERTAIN BEHAVIOR  PROCEDURE:  Procedure(s): TOTAL THYROIDECTOMY (N/A)  SURGEON:  Surgeon(s) and Role:    * Armandina Gemma, MD - Primary  ASSISTANTS: Donnie Mesa, MD, FACS   ANESTHESIA:   general  EBL:  Total I/O In: 1000 [I.V.:1000] Out: 50 [Blood:50]  BLOOD ADMINISTERED:none  DRAINS: none   LOCAL MEDICATIONS USED:  NONE  SPECIMEN:  Excision  DISPOSITION OF SPECIMEN:  PATHOLOGY  COUNTS:  YES  TOURNIQUET:  * No tourniquets in log *  DICTATION: .Other Dictation: Dictation Number [id number]  PLAN OF CARE: Admit for overnight observation  PATIENT DISPOSITION:  PACU - hemodynamically stable.   Delay start of Pharmacological VTE agent (>24hrs) due to surgical blood loss or risk of bleeding: yes  Earnstine Regal, MD, [facility] Office: [phone]

## 2014-06-12 NOTE — Anesthesia Preprocedure Evaluation (Addendum)
Anesthesia Evaluation  Patient identified by MRN, date of birth, ID band Patient awake    Reviewed: Allergy & Precautions, NPO status , Patient's Chart, lab work & pertinent test results  Airway Mallampati: II  TM Distance: >3 FB Neck ROM: Full    Dental no notable dental hx.    Pulmonary shortness of breath, former smoker,  breath sounds clear to auscultation  Pulmonary exam normal       Cardiovascular hypertension, + CAD + dysrhythmias Atrial Fibrillation + pacemaker Rhythm:Regular Rate:Normal     Neuro/Psych CVA negative psych ROS   GI/Hepatic negative GI ROS, Neg liver ROS, GERD-  ,  Endo/Other  negative endocrine ROSdiabetes, Type 2, Oral Hypoglycemic Agents, Insulin Dependent  Renal/GU negative Renal ROS     Musculoskeletal  (+) Arthritis -,   Abdominal   Peds  Hematology negative hematology ROS (+)   Anesthesia Other Findings   Reproductive/Obstetrics negative OB ROS                            Anesthesia Physical Anesthesia Plan  ASA: III  Anesthesia Plan: General   Post-op Pain Management:    Induction: Intravenous  Airway Management Planned: Oral ETT  Additional Equipment: None  Intra-op Plan:   Post-operative Plan: Extubation in OR  Informed Consent: I have reviewed the patients History and Physical, chart, labs and discussed the procedure including the risks, benefits and alternatives for the proposed anesthesia with the patient or authorized representative who has indicated his/her understanding and acceptance.   Dental advisory given  Plan Discussed with: CRNA  Anesthesia Plan Comments:         Anesthesia Quick Evaluation

## 2014-06-12 NOTE — Transfer of Care (Signed)
Immediate Anesthesia Transfer of Care Note  Patient: Clifford [name]  Procedure(s) Performed: Procedure(s): TOTAL THYROIDECTOMY (N/A)  Patient Location: PACU  Anesthesia Type:General  Level of Consciousness: awake and alert   Airway & Oxygen Therapy: Patient Spontanous Breathing and Patient connected to nasal cannula oxygen  Post-op Assessment: Report given to PACU RN and Post -op Vital signs reviewed and stable  Post vital signs: Reviewed and stable  Last Vitals:  Filed Vitals:   06/12/14 0635  BP:   Pulse:   Temp: 36.8 C  Resp:     Complications: No apparent anesthesia complications

## 2014-06-12 NOTE — Interval H&P Note (Signed)
History and Physical Interval Note:  06/12/2014 7:23 AM  Clifford [name]  has presented today for surgery, with the diagnosis of THYROID NEOPLASM OF UNCERTAIN BEHAVIOR.  The various methods of treatment have been discussed with the patient and family. After consideration of risks, benefits and other options for treatment, the patient has consented to    Procedure(s): TOTAL THYROIDECTOMY (N/A) as a surgical intervention .    The patient's history has been reviewed, patient examined, no change in status, stable for surgery.  I have reviewed the patient's chart and labs.  Questions were answered to the patient's satisfaction.    Earnstine Regal, MD, [facility] Office: [place]

## 2014-06-12 NOTE — Anesthesia Procedure Notes (Signed)
Procedure Name: Intubation Date/Time: 06/12/2014 7:39 AM Performed by: [place] Pink Pre-anesthesia Checklist: Patient identified, Emergency Drugs available, Suction available, Patient being monitored and Timeout performed Patient Re-evaluated:Patient Re-evaluated prior to inductionOxygen Delivery Method: Circle system utilized Preoxygenation: Pre-oxygenation with 100% oxygen Intubation Type: IV induction Ventilation: Mask ventilation without difficulty and Oral airway inserted - appropriate to patient size Laryngoscope Size: Mac and 4 Grade View: Grade I Tube type: Oral Tube size: 7.5 mm Number of attempts: 1 Airway Equipment and Method: LTA kit utilized and Stylet Placement Confirmation: ETT inserted through vocal cords under direct vision,  positive ETCO2 and breath sounds checked- equal and bilateral Secured at: 23 cm Tube secured with: Tape Dental Injury: Teeth and Oropharynx as per pre-operative assessment

## 2014-06-12 NOTE — Anesthesia Postprocedure Evaluation (Signed)
  Anesthesia Post-op Note  Patient: Clifford [name]  Procedure(s) Performed: Procedure(s): TOTAL THYROIDECTOMY (N/A)  Patient Location: PACU  Anesthesia Type:General  Level of Consciousness: awake, alert , oriented and patient cooperative  Airway and Oxygen Therapy: Patient Spontanous Breathing and Patient connected to nasal cannula oxygen  Post-op Pain: none  Post-op Assessment: Post-op Vital signs reviewed, Patient's Cardiovascular Status Stable, Respiratory Function Stable, Patent Airway, No signs of Nausea or vomiting and Pain level controlled  Post-op Vital Signs: Reviewed and stable  Last Vitals:  Filed Vitals:   06/12/14 1139  BP: 161/71  Pulse: 81  Temp: 36.8 C  Resp: 13    Complications: No apparent anesthesia complications

## 2014-06-13 ENCOUNTER — Encounter (HOSPITAL_COMMUNITY): Payer: Self-pay | Admitting: Surgery

## 2014-06-13 DIAGNOSIS — D34 Benign neoplasm of thyroid gland: Secondary | ICD-10-CM | POA: Diagnosis not present

## 2014-06-13 LAB — BASIC METABOLIC PANEL
Anion gap: 6 (ref 5–15)
BUN: 14 mg/dL (ref 6–23)
CHLORIDE: 102 mmol/L (ref 96–112)
CO2: 33 mmol/L — ABNORMAL HIGH (ref 19–32)
CREATININE: 1 mg/dL (ref 0.50–1.35)
Calcium: 9.4 mg/dL (ref 8.4–10.5)
GFR calc Af Amer: 84 mL/min — ABNORMAL LOW (ref >90)
GFR, EST NON AFRICAN AMERICAN: 73 mL/min — AB (ref >90)
Glucose, Bld: 173 mg/dL — ABNORMAL HIGH (ref 70–99)
POTASSIUM: 4 mmol/L (ref 3.5–5.1)
Sodium: 141 mmol/L (ref 135–145)

## 2014-06-13 LAB — GLUCOSE, CAPILLARY: GLUCOSE-CAPILLARY: 157 mg/dL — AB (ref 70–99)

## 2014-06-13 MED ORDER — HYDROCODONE-ACETAMINOPHEN 5-325 MG PO TABS: 1.0000 | ORAL_TABLET | ORAL | Status: DC | PRN

## 2014-06-13 MED ORDER — CALCIUM CARBONATE 1250 (500 CA) MG PO TABS
2.0000 | ORAL_TABLET | Freq: Three times a day (TID) | ORAL | Status: DC
Start: 2014-06-13 — End: 2014-08-11

## 2014-06-13 MED ORDER — LEVOTHYROXINE SODIUM 100 MCG PO TABS: 100.0000 ug | ORAL_TABLET | Freq: Every day | ORAL | Status: AC

## 2014-06-13 NOTE — Op Note (Signed)
NAMEADOLPHUS, HANF                 ACCOUNT NO.:  [id number]  MEDICAL RECORD NO.:  [id number]  LOCATION:  6N30C                        FACILITY:  [facility]  PHYSICIAN:  Earnstine Regal, MD      DATE OF BIRTH:  Oct 05, 1940  DATE OF PROCEDURE:  06/12/2014                              OPERATIVE REPORT   PREOPERATIVE DIAGNOSIS:  Thyroid neoplasm of uncertain behavior.  POSTOPERATIVE DIAGNOSIS:  Thyroid neoplasm of uncertain behavior.  PROCEDURE:  Total thyroidectomy.  SURGEON:  Earnstine Regal, MD, FACS  ASSISTANT:  Donnie Mesa, MD, FACS  ANESTHESIA:  General.  ESTIMATED BLOOD LOSS:  50 mL.  PREPARATION:  ChloraPrep.  COMPLICATIONS:  None.  INDICATIONS:  Patient is a 74 year old male referred by Dr. Aviva Signs and Dr. Loni Beckwith in [place], [place], for evaluation of thyroid mass with significant atypia.  The patient had been noted incidentally on a previous radiographic study to have a left thyroid nodule.  Ultrasound in September 2015, showed a right thyroid lobe containing multiple sub centimeter nodules.  Left lobe was enlarged and contained a dominant 3.9 cm mass.  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  The patient has significant comorbidities including history of stroke, atrial fibrillation, pacemaker, and chronic anticoagulation.  The patient now comes to Surgery at [facility] for thyroidectomy.  BODY OF REPORT:  Procedure was done in OR #1 at the [place].  The patient was brought to the operating room, placed in supine position on the operating room table.  Following administration of general anesthesia, the patient was positioned and then prepped and draped in usual aseptic fashion.  After ascertaining that an adequate level of anesthesia had been achieved, a Kocher incision was made with a #15 blade.  Dissection was carried  through subcutaneous tissues and platysma.  Hemostasis was achieved with the electrocautery.  Skin flaps were developed cephalad and caudad from the thyroid notch to the sternal notch.  A Mahorner self-retaining retractor was placed for exposure.  Strap muscles were incised in the midline. Dissection was begun on the left side of the neck.  Dissection was somewhat difficult due to the inability to extend the neck.  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  With gentle blunt dissection, the lobe was mobilized.  Superior pole vessels were dissected out and divided individually between small and medium Ligaclips with the Harmonic scalpel.  Middle thyroid vein was divided between Ligaclips.  Inferior venous tributaries were divided between medium Ligaclips with the Harmonic scalpel.  Gland was gently mobilized and rolled anteriorly.  Parathyroid tissue was identified and preserved. Branches of the inferior thyroid artery were divided between small and medium Ligaclips with the Harmonic scalpel keeping the dissection close to the capsule of the thyroid.  Recurrent nerve was not positively identified, but the anatomy is somewhat distorted due to the large size of the left thyroid lobe and slight deviation of the trachea to the right.  Ligament of Gwenlyn Found was released with the electrocautery.  Gland was rolled anteriorly and the isthmus was mobilized across the midline. There was a small pyramidal  lobe which was dissected off the anterior thyroid cartilage with the electrocautery and resected with the isthmus. Dry packs were placed in the left neck.  Next, we turned our attention to the right thyroid lobe.  Strap muscles were again reflected laterally.  Right lobe was normal in size.  It was gently dissected out.  Superior pole vessels were divided individually between small and medium Ligaclips with the Harmonic scalpel.  Inferior venous tributaries were divided between Ligaclips.   Gland was rolled anteriorly and the middle thyroid vein was divided between Ligaclips with the Harmonic scalpel.  Branches of the inferior thyroid artery were divided between small Ligaclips.  Recurrent nerve is identified and preserved.  Tubercle of Zuckerkandl is closely adherent to the nerve. It was small.  It was left in situ and the ligament of Berry transected with the electrocautery, and the gland mobilized onto the anterior trachea from which it was completely excised with the electrocautery.  Sutures were used to mark the left superior pole of the thyroid gland. The entire gland was submitted to Pathology for permanent review.  The neck was irrigated with warm saline and hemostasis was achieved with small and medium Ligaclips.  Fibrillar was placed throughout the operative field.  Strap muscles were reapproximated in the midline with interrupted 3-0 Vicryl sutures.  Platysma was closed with interrupted 3- 0 Vicryl sutures.  Skin was closed with a running 4-0 Monocryl subcuticular suture.  Dermabond was applied to the skin incision.  The patient was awakened from anesthesia and brought to the recovery room. The patient tolerated the procedure well.   Earnstine Regal, MD, [facility] Office: [phone]   TMG/MEDQ  D:  06/12/2014  T:  06/13/2014  Job:  [id number]  cc:   Loni Beckwith, MD Jamesetta So, M.D. Consuello Masse, MD

## 2014-06-13 NOTE — Progress Notes (Signed)
Discharge home. Home discharge instruction given with prescription to spouse, no question verbalized.

## 2014-06-13 NOTE — Discharge Summary (Signed)
Physician Discharge Summary  Patient ID: Clifford [name] MRN: [id number] DOB/AGE: January 03, 1941 74 y.o.  Admit date: 06/12/2014 Discharge date: 06/13/2014  Admission Diagnoses:  Discharge Diagnoses:  Principal Problem:   Neoplasm of uncertain behavior of thyroid gland, left lobe   Discharged Condition: good  Hospital Course: unremarkable  Calcium 9.4,  Wound clean,  Tolerating diet with good pain control and strong voice.   Consults: None  Significant Diagnostic Studies: labs:  CMP     Component Value Date/Time   NA 141 06/13/2014 0452   K 4.0 06/13/2014 0452   CL 102 06/13/2014 0452   CO2 33* 06/13/2014 0452   GLUCOSE 173* 06/13/2014 0452   BUN 14 06/13/2014 0452   CREATININE 1.00 06/13/2014 0452   CALCIUM 9.4 06/13/2014 0452   GFRNONAA 73* 06/13/2014 0452   GFRAA 84* 06/13/2014 0452     Treatments: surgery: total thyroidectomy  Discharge Exam: Blood pressure 128/63, pulse 87, temperature 99.5 F (37.5 C), temperature source Oral, resp. rate 14, height 5\' 11"  (1.803 m), weight 200 lb (90.719 kg), SpO2 97 %. Incision/Wound:CDI NO HEMATOMA  NO HOARSENESS  Disposition: 06-Home-Health Care Svc  Discharge Instructions    Diet - low sodium heart healthy    Complete by:  As directed      Increase activity slowly    Complete by:  As directed             Medication List    STOP taking these medications        apixaban 5 MG Tabs tablet  Commonly known as:  ELIQUIS      TAKE these medications        aspirin EC 81 MG tablet  Take 81 mg by mouth daily.     calcium carbonate 1250 (500 CA) MG tablet  Commonly known as:  OS-CAL - dosed in mg of elemental calcium  Take 2 tablets (1,000 mg of elemental calcium total) by mouth 3 (three) times daily with meals.     calcium-vitamin D 500-200 MG-UNIT per tablet  Commonly known as:  OSCAL WITH D  Take 2 tablets by mouth 3 (three) times daily.     folic acid 1 MG tablet  Commonly known as:  FOLVITE  Take 1 mg by mouth  daily.     glipiZIDE 10 MG tablet  Commonly known as:  GLUCOTROL  Take 10 mg by mouth daily.     hydrochlorothiazide 25 MG tablet  Commonly known as:  HYDRODIURIL  Take 25 mg by mouth daily.     HYDROcodone-acetaminophen 5-325 MG per tablet  Commonly known as:  NORCO/VICODIN  Take 1-2 tablets by mouth every 4 (four) hours as needed for moderate pain.     insulin glargine 100 UNIT/ML injection  Commonly known as:  LANTUS  Inject 50 Units into the skin at bedtime.     lisinopril 20 MG tablet  Commonly known as:  PRINIVIL,ZESTRIL  Take 20 mg by mouth daily.     lovastatin 40 MG tablet  Commonly known as:  MEVACOR  Take 60 mg by mouth at bedtime.     metFORMIN 500 MG tablet  Commonly known as:  GLUCOPHAGE  Take 1,000 mg by mouth 2 (two) times daily with a meal.     methotrexate 2.5 MG tablet  Commonly known as:  RHEUMATREX  Take 10 mg by mouth once a week. On Mondays- Caution:Chemotherapy. Protect from light.     nitroGLYCERIN 0.4 MG SL tablet  Commonly known as:  NITROSTAT  Place 0.4 mg under the tongue every 5 (five) minutes as needed for chest pain.     oxyCODONE 5 MG immediate release tablet  Commonly known as:  Oxy IR/ROXICODONE  Take 1-2 tablets (5-10 mg total) by mouth every 4 (four) hours as needed for moderate pain.     ranitidine 150 MG tablet  Commonly known as:  ZANTAC  Take 150 mg by mouth as needed for heartburn.     SYNTHROID 100 MCG tablet  Generic drug:  levothyroxine  Take 1 tablet (100 mcg total) by mouth daily.     warfarin 3 MG tablet  Commonly known as:  COUMADIN  Take 3-6 mg by mouth daily. Take 3 mg by mouth daily for 1 day alternating with 6 mg by mouth daily for 2 days.           Follow-up Information    Follow up with Earnstine Regal, MD. Schedule an appointment as soon as possible for a visit in 3 weeks.   Specialty:  General Surgery   Why:  For wound re-check   Contact information:   [address] [place]  [address] [phone]       Signed: Turner Daniels. 06/13/2014, 8:48 AM

## 2014-06-13 NOTE — Discharge Instructions (Signed)
° °  THYROID & PARATHYROID SURGERY - POST OP INSTRUCTIONS  Always review your discharge instruction sheet from the facility where your surgery was performed.  A prescription for pain medication may be given to you upon discharge.  Take your pain medication as prescribed.  If narcotic pain medicine is not needed, then you may take acetaminophen (Tylenol) or ibuprofen (Advil) as needed.  Take your usually prescribed medications unless otherwise directed.  If you need a refill on your pain medication, please contact your pharmacy. They will contact our office to request authorization.  Prescriptions will not be processed after 5 pm or on weekends.  Start with a light diet upon arrival home, such as soup and crackers or toast.  Be sure to drink plenty of fluids daily.  Resume your normal diet the day after surgery.  Most patients will experience some swelling and bruising on the chest and neck area.  Ice packs will help.  Swelling and bruising can take several days to resolve.   It is common to experience some constipation if taking pain medication after surgery.  Increasing fluid intake and taking a stool softener will usually help or prevent this problem.  A mild laxative (Milk of Magnesia or Miralax) should be taken according to package directions if there are no bowel movements after 48 hours.  You may remove your bandages 24-48 hours after surgery, and you may shower at that time.  You have steri-strips (small skin tapes) in place directly over the incision.  These strips should be left on the skin for 7-10 days and then removed.  You may resume regular (light) daily activities beginning the next day--such as daily self-care, walking, climbing stairs--gradually increasing activities as tolerated.  You may have sexual intercourse when it is comfortable.  Refrain from any heavy lifting or straining until approved by your doctor.  You may drive when you no longer are taking prescription pain  medication, you can comfortably wear a seatbelt, and you can safely maneuver your car and apply brakes.  You should see your doctor in the office for a follow-up appointment approximately two to three weeks after your surgery.  Make sure that you call for this appointment within a day or two after you arrive home to insure a convenient appointment time.  WHEN TO CALL YOUR DOCTOR: -- Fever greater than 101.5 -- Inability to urinate -- Nausea and/or vomiting - persistent -- Extreme swelling or bruising -- Continued bleeding from incision -- Increased pain, redness, or drainage from the incision -- Difficulty swallowing or breathing -- Muscle cramping or spasms -- Numbness or tingling in hands or around lips  The clinic staff is available to answer your questions during regular business hours.  Please dont hesitate to call and ask to speak to one of the nurses if you have concerns.  Earnstine Regal, MD, [facility] Office: [phone]   Restart coumadin in 48 hours.

## 2014-06-13 NOTE — Progress Notes (Signed)
1 Day Post-Op  Subjective: DOING WELL  Objective: Vital signs in last 24 hours: Temp:  [96.4 F (35.8 C)-99.5 F (37.5 C)] 99.5 F (37.5 C) (01/29 0707) Pulse Rate:  [71-88] 87 (01/29 0707) Resp:  [9-18] 14 (01/29 0707) BP: (97-174)/(57-87) 128/63 mmHg (01/29 0707) SpO2:  [93 %-99 %] 97 % (01/29 0707) Weight:  [200 lb (90.719 kg)] 200 lb (90.719 kg) (01/28 1139) Last BM Date: 06/11/14  Intake/Output from previous day: 01/28 0701 - 01/29 0700 In: 2109.8 [I.V.:2109.8] Out: 845 [Urine:795; Blood:50] Intake/Output this shift: Total I/O In: -  Out: 375 [Urine:375]  Incision/Wound:INCISION CDI voice strong no hematoma  Lab Results:   Recent Labs  06/10/14 1528  WBC 7.4  HGB 13.8  HCT 38.4*  PLT 185   BMET  Recent Labs  06/10/14 1528 06/13/14 0452  NA 137 141  K 4.0 4.0  CL 105 102  CO2 23 33*  GLUCOSE 217* 173*  BUN 20 14  CREATININE 0.91 1.00  CALCIUM 9.7 9.4   PT/INR  Recent Labs  06/10/14 1528  LABPROT 14.8  INR 1.14   ABG No results for input(s): PHART, HCO3 in the last 72 hours.  Invalid input(s): PCO2, PO2  Studies/Results: No results found.  Anti-infectives: Anti-infectives    Start     Dose/Rate Route Frequency Ordered Stop   06/12/14 0600  ceFAZolin (ANCEF) IVPB 2 g/50 mL premix     2 g100 mL/hr over 30 Minutes Intravenous On call to O.R. 06/11/14 1506 06/12/14 0741      Assessment/Plan: s/p Procedure(s): TOTAL THYROIDECTOMY (N/A) Discharge  LOS: 1 day    Asaiah Scarber A. 06/13/2014

## 2014-06-24 ENCOUNTER — Encounter: Payer: Self-pay | Admitting: *Deleted

## 2014-08-11 ENCOUNTER — Ambulatory Visit (INDEPENDENT_AMBULATORY_CARE_PROVIDER_SITE_OTHER): Payer: Medicare Other | Admitting: Internal Medicine

## 2014-08-11 ENCOUNTER — Other Ambulatory Visit: Payer: Self-pay

## 2014-08-11 ENCOUNTER — Encounter: Payer: Self-pay | Admitting: Internal Medicine

## 2014-08-11 VITALS — BP 134/60 | HR 87 | Ht 71.0 in | Wt 207.8 lb

## 2014-08-11 DIAGNOSIS — I495 Sick sinus syndrome: Secondary | ICD-10-CM | POA: Diagnosis not present

## 2014-08-11 DIAGNOSIS — I10 Essential (primary) hypertension: Secondary | ICD-10-CM | POA: Diagnosis not present

## 2014-08-11 DIAGNOSIS — I480 Paroxysmal atrial fibrillation: Secondary | ICD-10-CM | POA: Diagnosis not present

## 2014-08-11 DIAGNOSIS — Z950 Presence of cardiac pacemaker: Secondary | ICD-10-CM | POA: Diagnosis not present

## 2014-08-11 LAB — MDC_IDC_ENUM_SESS_TYPE_INCLINIC
Battery Impedance: 2821 Ohm
Battery Remaining Longevity: 25 mo
Brady Statistic AP VP Percent: 0.1 %
Brady Statistic AS VS Percent: 99.9 %
Lead Channel Impedance Value: 547 Ohm
Lead Channel Pacing Threshold Amplitude: 1 V
Lead Channel Pacing Threshold Pulse Width: 0.8 ms
Lead Channel Sensing Intrinsic Amplitude: 11.2 mV
Lead Channel Sensing Intrinsic Amplitude: 2.8 mV
Lead Channel Setting Pacing Amplitude: 2.5 V
MDC IDC MSMT BATTERY VOLTAGE: 2.75 V
MDC IDC MSMT LEADCHNL RV IMPEDANCE VALUE: 569 Ohm
MDC IDC SESS DTM: 20160328170519
MDC IDC SET LEADCHNL RV PACING PULSEWIDTH: 0.8 ms
MDC IDC SET LEADCHNL RV SENSING SENSITIVITY: 2.8 mV
MDC IDC STAT BRADY AP VS PERCENT: 0.1 %
MDC IDC STAT BRADY AS VP PERCENT: 0.1 %

## 2014-08-11 MED ORDER — NITROGLYCERIN 0.4 MG SL SUBL: 0.4000 mg | SUBLINGUAL_TABLET | SUBLINGUAL | Status: AC | PRN

## 2014-08-11 NOTE — Patient Instructions (Signed)
Your physician has recommended you make the following change in your medication:   Stop Aspirin  Your physician wants you to follow-up in: 6 months with Pryor Curia in [place]. You will receive a reminder letter in the mail two months in advance. If you don't receive a letter, please call our office to schedule the follow-up appointment.  Your physician wants you to follow-up in: 12 months with Dr. Rayann Heman in [place]. You will receive a reminder letter in the mail two months in advance. If you don't receive a letter, please call our office to schedule the follow-up appointment.

## 2014-08-12 ENCOUNTER — Encounter: Payer: Self-pay | Admitting: Internal Medicine

## 2014-08-12 DIAGNOSIS — I495 Sick sinus syndrome: Secondary | ICD-10-CM | POA: Insufficient documentation

## 2014-08-12 NOTE — Progress Notes (Signed)
Electrophysiology Office Note   Date:  08/12/2014   ID:  Clifford [name], DOB [birth year], MRN [id number]  PCP:  Manon Hilding, MD  Cardiologist:  Dr Bronson Ing Primary Electrophysiologist: Thompson Grayer, MD    Chief Complaint  Patient presents with  . Atrial Fibrillation     History of Present Illness: Clifford [name] is a 74 y.o. male who presents today for electrophysiology evaluation.   He presents to establish in the device clinic.  I have not seen him since 2011.  His afib has progressed to longstanding persistent.  He is only intermittently compliant with anticoagulation.  He has occasional numbness in the tips of fingers in his L hand.  He denies exertional symptoms.  He was previously taking eliquis 10mg  daily rather than 5mg  BID and also was taking ASA.  He did have + blood in stool with this.  Since switching to 5mg  BID this has improved.  Today, he denies symptoms of palpitations, chest pain, shortness of breath, orthopnea, PND, lower extremity edema, claudication, dizziness, presyncope, syncope, or neurologic sequela. The patient is tolerating medications without difficulties and is otherwise without complaint today.    Past Medical History  Diagnosis Date  . Persistent atrial fibrillation   . Hypertensive cardiovascular disease   . Rheumatoid arthritis(714.0)   . CAD (coronary artery disease)   . Goiter   . Stroke 10/15    left sided weakness  . Shortness of breath dyspnea     exertion  . Sick sinus syndrome   . Type II or unspecified type diabetes mellitus without mention of complication, not stated as uncontrolled     fasting cbg 150-180  . GERD (gastroesophageal reflux disease)   . Medically noncompliant    Past Surgical History  Procedure Laterality Date  . Pacemaker insertion      MDT sigma  . Back surgery    . Rotator cuff repair Bilateral   . Cardiac catheterization    . Coronary angioplasty    . Thyroidectomy  06/12/2014  . Thyroidectomy N/A 06/12/2014     Procedure: TOTAL THYROIDECTOMY;  Surgeon: Armandina Gemma, MD;  Location: [facility];  Service: General;  Laterality: N/A;     Current Outpatient Prescriptions  Medication Sig Dispense Refill  . apixaban (ELIQUIS) 5 MG TABS tablet Take 5 mg by mouth 2 (two) times daily.    . folic acid (FOLVITE) 1 MG tablet Take 1 mg by mouth daily.    Marland Kitchen glipiZIDE (GLUCOTROL) 10 MG tablet Take 10 mg by mouth daily.     . hydrochlorothiazide (HYDRODIURIL) 25 MG tablet Take 25 mg by mouth daily.    Marland Kitchen HYDROcodone-acetaminophen (NORCO/VICODIN) 5-325 MG per tablet Take 1-2 tablets by mouth every 4 (four) hours as needed for moderate pain. 30 tablet 0  . insulin glargine (LANTUS) 100 UNIT/ML injection Inject 50 Units into the skin at bedtime.     Marland Kitchen levothyroxine (SYNTHROID) 100 MCG tablet Take 1 tablet (100 mcg total) by mouth daily before breakfast. 30 tablet 3  . lisinopril (PRINIVIL,ZESTRIL) 20 MG tablet Take 20 mg by mouth daily.    Marland Kitchen lovastatin (MEVACOR) 40 MG tablet Take 60 mg by mouth at bedtime.     . metFORMIN (GLUCOPHAGE) 500 MG tablet Take 1,000 mg by mouth 2 (two) times daily with a meal.     . methotrexate (RHEUMATREX) 2.5 MG tablet Take 10 mg by mouth once a week. On Mondays- Caution:Chemotherapy. Protect from light.    . nitroGLYCERIN (NITROSTAT) 0.4 MG  SL tablet Place 1 tablet (0.4 mg total) under the tongue every 5 (five) minutes as needed for chest pain (MAX 3 TABLETS). 25 tablet 3  . oxyCODONE (OXY IR/ROXICODONE) 5 MG immediate release tablet Take 1-2 tablets (5-10 mg total) by mouth every 4 (four) hours as needed for moderate pain. 20 tablet 0  . ranitidine (ZANTAC) 150 MG tablet Take 150 mg by mouth daily as needed for heartburn.      No current facility-administered medications for this visit.    Allergies:   Demerol   Social History:  The patient  reports that he quit smoking about 19 years ago. His smoking use included Cigarettes. He started smoking about 69 years ago. He has a 100 pack-year  smoking history. He has never used smokeless tobacco. He reports that he does not drink alcohol or use illicit drugs.   Family History:  The patient's family history includes Alcoholism in his brother and brother; Cancer in his brother, brother, daughter, and paternal grandfather; Diabetes in his mother.    ROS:  Please see the history of present illness.   All other systems are reviewed and negative.    PHYSICAL EXAM: VS:  BP 134/60 mmHg  Pulse 87  Ht 5\' 11"  (1.803 m)  Wt 207 lb 12.8 oz (94.257 kg)  BMI 28.99 kg/m2 , BMI Body mass index is 28.99 kg/(m^2). GEN: Well nourished, well developed, in no acute distress HEENT: normal Neck: no JVD, carotid bruits, or masses Cardiac: iRRR; no murmurs, rubs, or gallops,no edema  Respiratory:  clear to auscultation bilaterally, normal work of breathing GI: soft, nontender, nondistended, + BS MS: no deformity or atrophy Skin: warm and dry, device pocket is well healed Neuro:  Strength and sensation are intact Psych: euthymic mood, full affect  Device interrogation is reviewed today in detail.  See PaceArt for details.   Recent Labs: 06/10/2014: Hemoglobin 13.8; Platelets 185 06/13/2014: BUN 14; Creatinine 1.00; Potassium 4.0; Sodium 141    Lipid Panel     Component Value Date/Time   CHOL 153 02/09/2014 0556   TRIG 139 02/09/2014 0556   HDL 38* 02/09/2014 0556   CHOLHDL 4.0 02/09/2014 0556   VLDL 28 02/09/2014 0556   LDLCALC 87 02/09/2014 0556     Wt Readings from Last 3 Encounters:  08/11/14 207 lb 12.8 oz (94.257 kg)  02/10/14 201 lb 15.1 oz (91.6 kg)  01/16/14 209 lb (94.802 kg)      Other studies Reviewed: Additional studies/ records that were reviewed today include: Dr Raylene Everts notes, my notes from [facility]:  1.  Atrial fibrillation He now likely has permanent afib.  He is asymptomatic and well rate controlled.  I will therefore reprogram VVIR today. chads2vasc score is at least 6. This patients  CHA2DS2-VASc Score and unadjusted Ischemic Stroke Rate (% per year) is equal to 9.7 % stroke rate/year from a score of 6.  The importance of anticoagulation was discussed today.  He will comply with eliquis.  Stop ASA to prevent bleeding.  Above score calculated as 1 point each if present [CHF, HTN, DM, Vascular=MI/PAD/Aortic Plaque, Age if 65-74, or Male] Above score calculated as 2 points each if present [Age > 75, or Stroke/TIA/TE]   2. Sick sinus syndrome Normal pacemaker function See Claudia Desanctis Art report Program VVIR today Return to the device clinic in 6 months Will need close battery follow-up going forward  3. Hypertensive cardiovascular disease Stable No change required today  Current medicines  are reviewed at length with the patient today.   The patient does not have concerns regarding his medicines.  The following changes were made today:  none  Labs/ tests ordered today include:  Orders Placed This Encounter  Procedures  . Implantable device check  . EKG 12-Lead    Signed, Thompson Grayer, MD  08/12/2014 9:55 PM     [place] [address] [phone] (office) [phone] (fax)

## 2015-02-18 ENCOUNTER — Ambulatory Visit (INDEPENDENT_AMBULATORY_CARE_PROVIDER_SITE_OTHER): Payer: Commercial Managed Care - HMO | Admitting: *Deleted

## 2015-02-18 DIAGNOSIS — Z950 Presence of cardiac pacemaker: Secondary | ICD-10-CM

## 2015-02-18 DIAGNOSIS — I480 Paroxysmal atrial fibrillation: Secondary | ICD-10-CM

## 2015-02-18 LAB — CUP PACEART INCLINIC DEVICE CHECK
Brady Statistic RV Percent Paced: 7.8 %
Date Time Interrogation Session: 20161005100802
Lead Channel Impedance Value: 552 Ohm
Lead Channel Pacing Threshold Pulse Width: 0.4 ms
Lead Channel Sensing Intrinsic Amplitude: 11.2 mV
Lead Channel Setting Pacing Pulse Width: 0.8 ms
Lead Channel Setting Sensing Sensitivity: 2.8 mV
MDC IDC MSMT LEADCHNL RV PACING THRESHOLD AMPLITUDE: 1 V
MDC IDC SET LEADCHNL RV PACING AMPLITUDE: 2.5 V

## 2015-02-18 NOTE — Progress Notes (Signed)
Pacemaker check in clinic. Normal device function. Threshold, sensing, impedance consistent with previous measurements. Device programmed to maximize longevity. 12 high ventricular rates noted- longest 3 seconds, pk V 213bpm. History of PAF- pt stopped taking previously prescribed BID eliquis. Device programmed at appropriate safety margins. Histogram distribution appropriate for patient activity level. Device programmed to optimize intrinsic conduction. Estimated longevity <2- 36 months. ROV with [facility] device clinic 03/20/15 at 1pm for battery check only. Next billable January. ROV with [facility] in March.

## 2015-03-09 ENCOUNTER — Encounter: Payer: Self-pay | Admitting: Internal Medicine

## 2015-03-20 ENCOUNTER — Ambulatory Visit (INDEPENDENT_AMBULATORY_CARE_PROVIDER_SITE_OTHER): Payer: Commercial Managed Care - HMO | Admitting: *Deleted

## 2015-03-20 DIAGNOSIS — Z950 Presence of cardiac pacemaker: Secondary | ICD-10-CM

## 2015-03-20 LAB — CUP PACEART INCLINIC DEVICE CHECK
Date Time Interrogation Session: 20161104133858
Implantable Lead Implant Date: 20030625
Implantable Lead Implant Date: 20030625
Implantable Lead Location: 753860
Implantable Lead Model: 5076
Lead Channel Setting Pacing Amplitude: 2.5 V
Lead Channel Setting Pacing Pulse Width: 0.8 ms
MDC IDC LEAD LOCATION: 753859
MDC IDC SET LEADCHNL RV SENSING SENSITIVITY: 2.8 mV

## 2015-03-20 NOTE — Progress Notes (Signed)
In clinic check for battery longevity. <2-37 months remaining. 2 HVR episodes- both 1 second @ 192bpm (markers only). ROV with device clinic in [place] 04/27/15, Brent with [facility] in March.

## 2015-03-24 ENCOUNTER — Ambulatory Visit (INDEPENDENT_AMBULATORY_CARE_PROVIDER_SITE_OTHER): Payer: Commercial Managed Care - HMO | Admitting: Cardiovascular Disease

## 2015-03-24 ENCOUNTER — Encounter: Payer: Self-pay | Admitting: Cardiovascular Disease

## 2015-03-24 VITALS — BP 115/67 | HR 80 | Ht 71.0 in | Wt 194.0 lb

## 2015-03-24 DIAGNOSIS — I10 Essential (primary) hypertension: Secondary | ICD-10-CM

## 2015-03-24 DIAGNOSIS — Z9114 Patient's other noncompliance with medication regimen: Secondary | ICD-10-CM

## 2015-03-24 DIAGNOSIS — Z950 Presence of cardiac pacemaker: Secondary | ICD-10-CM | POA: Diagnosis not present

## 2015-03-24 DIAGNOSIS — E785 Hyperlipidemia, unspecified: Secondary | ICD-10-CM

## 2015-03-24 DIAGNOSIS — I482 Chronic atrial fibrillation: Secondary | ICD-10-CM

## 2015-03-24 DIAGNOSIS — I495 Sick sinus syndrome: Secondary | ICD-10-CM

## 2015-03-24 DIAGNOSIS — I4821 Permanent atrial fibrillation: Secondary | ICD-10-CM

## 2015-03-24 DIAGNOSIS — I2510 Atherosclerotic heart disease of native coronary artery without angina pectoris: Secondary | ICD-10-CM | POA: Diagnosis not present

## 2015-03-24 DIAGNOSIS — Z91148 Patient's other noncompliance with medication regimen for other reason: Secondary | ICD-10-CM

## 2015-03-24 NOTE — Patient Instructions (Signed)
Continue all current medications. Your physician wants you to follow up in:  1 year.  You will receive a reminder letter in the mail one-two months in advance.  If you don't receive a letter, please call our office to schedule the follow up appointment   

## 2015-03-24 NOTE — Progress Notes (Signed)
Patient ID: Clifford [name], male   DOB: 04-12-1941, 74 y.o.   MRN: [id number]      SUBJECTIVE:  In summary, he has a history of coronary artery disease with prior stenting of the LAD and RCA. He also has a history of paroxysmal atrial fibrillation and had been on Eliquis but has been noncompliant in spite of the importance of anticoagulation stressed to him by various providers.   He has sick sinus syndrome and has a pacemaker. He also has a history of essential hypertension and rheumatoid arthritis. He has a prior history of tobacco use and mild emphysema as demonstrated by pulmonary function testing on 02/17/14.  He underwent a normal nuclear stress test on 12/19/13.   Pacemaker interrogation on 03/20/15 demonstrated normal device function with 2 high ventricular rate episodes which were very brief.  Review of Systems: As per "subjective", otherwise negative.  Allergies  Allergen Reactions  . Demerol Nausea And Vomiting    Current Outpatient Prescriptions  Medication Sig Dispense Refill  . aspirin EC 81 MG tablet Take 81 mg by mouth daily.    . folic acid (FOLVITE) 1 MG tablet Take 1 mg by mouth daily.    Marland Kitchen glipiZIDE (GLUCOTROL) 10 MG tablet Take 10 mg by mouth daily.     . hydrochlorothiazide (HYDRODIURIL) 25 MG tablet Take 25 mg by mouth daily.    Marland Kitchen HYDROcodone-acetaminophen (NORCO/VICODIN) 5-325 MG per tablet Take 1-2 tablets by mouth every 4 (four) hours as needed for moderate pain. 30 tablet 0  . levothyroxine (SYNTHROID) 100 MCG tablet Take 1 tablet (100 mcg total) by mouth daily before breakfast. 30 tablet 3  . lisinopril (PRINIVIL,ZESTRIL) 20 MG tablet Take 20 mg by mouth daily.    Marland Kitchen lovastatin (MEVACOR) 40 MG tablet Take 60 mg by mouth at bedtime.     . metFORMIN (GLUCOPHAGE) 500 MG tablet Take 1,000 mg by mouth 2 (two) times daily with a meal.     . methotrexate (RHEUMATREX) 2.5 MG tablet Take 10 mg by mouth once a week. On Mondays- Caution:Chemotherapy. Protect from light.      . nitroGLYCERIN (NITROSTAT) 0.4 MG SL tablet Place 1 tablet (0.4 mg total) under the tongue every 5 (five) minutes as needed for chest pain (MAX 3 TABLETS). 25 tablet 3  . ranitidine (ZANTAC) 150 MG tablet Take 150 mg by mouth daily as needed for heartburn.     Nelva Nay SOLOSTAR 300 UNIT/ML SOPN Inject 40 Units into the skin daily.     No current facility-administered medications for this visit.    Past Medical History  Diagnosis Date  . Persistent atrial fibrillation ([place])   . Hypertensive cardiovascular disease   . Rheumatoid arthritis(714.0)   . CAD (coronary artery disease)   . Goiter   . Stroke ([place]) 10/15    left sided weakness  . Shortness of breath dyspnea     exertion  . Sick sinus syndrome ([place])   . Type II or unspecified type diabetes mellitus without mention of complication, not stated as uncontrolled     fasting cbg 150-180  . GERD (gastroesophageal reflux disease)   . Medically noncompliant     Past Surgical History  Procedure Laterality Date  . Pacemaker insertion      MDT sigma  . Back surgery    . Rotator cuff repair Bilateral   . Cardiac catheterization    . Coronary angioplasty    . Thyroidectomy  06/12/2014  . Thyroidectomy N/A 06/12/2014  Procedure: TOTAL THYROIDECTOMY;  Surgeon: Armandina Gemma, MD;  Location: [facility];  Service: General;  Laterality: N/A;    Social History   Social History  . Marital Status: Married    Spouse Name: N/A  . Number of Children: N/A  . Years of Education: N/A   Occupational History  . RETIRED    Social History Main Topics  . Smoking status: Former Smoker -- 2.00 packs/day for 50 years    Types: Cigarettes    Start date: 01/16/1945    Quit date: 05/17/1995  . Smokeless tobacco: Never Used     Comment: Patient states he has been smoking since he was 4  . Alcohol Use: No  . Drug Use: No  . Sexual Activity: Not on file   Other Topics Concern  . Not on file   Social History Narrative     Filed Vitals:    03/24/15 0838  BP: 115/67  Pulse: 80  Height: 5\' 11"  (1.803 m)  Weight: 194 lb (87.998 kg)    PHYSICAL EXAM General: NAD HEENT: Normal. Neck: No JVD Lungs: Clear to auscultation bilaterally with normal respiratory effort. CV: Nondisplaced PMI. Irregular rhythm, normal S1/S2, no S3, no murmur. No pretibial or periankle edema. No carotid bruit.   Abdomen: Soft, nontender, no distention.  Neurologic: Alert and oriented x 3.  Psych: Normal affect. Skin: Normal. Musculoskeletal: No gross deformities. Extremities: No clubbing or cyanosis.    ECG: Most recent ECG reviewed.      ASSESSMENT AND PLAN: 1. CAD with prior PCI of LAD and RCA: Stable ischemic heart disease. Lexiscan Cardiolite stress test was normal. Continue aspirin 81 mg daily and lovastatin 40 mg daily.   2. Permanent atrial fibrillation: Noncompliant with anticoagulation. Had been on Eliquis. I reinforced the importance of anticoagulation for stroke prevention but he will not comply.  3. Essential HTN: Controlled on present therapy. No changes.  4. Sick sinus syndrome with pacemaker: Follows with with Dr. Rayann Heman. Pacemaker in VVIR mode.  5. Hyperlipidemia: Continue statin therapy. Will obtain lipids from PCP's office.   Dispo: f/u 1 year.  Kate Sable, M.D., F.A.C.C.

## 2015-03-30 ENCOUNTER — Encounter: Payer: Self-pay | Admitting: Internal Medicine

## 2015-04-27 ENCOUNTER — Ambulatory Visit (INDEPENDENT_AMBULATORY_CARE_PROVIDER_SITE_OTHER): Payer: Commercial Managed Care - HMO | Admitting: *Deleted

## 2015-04-27 DIAGNOSIS — I4821 Permanent atrial fibrillation: Secondary | ICD-10-CM

## 2015-04-27 DIAGNOSIS — I495 Sick sinus syndrome: Secondary | ICD-10-CM

## 2015-04-27 DIAGNOSIS — I482 Chronic atrial fibrillation: Secondary | ICD-10-CM

## 2015-04-27 LAB — CUP PACEART INCLINIC DEVICE CHECK
Battery Remaining Longevity: 19 mo
Date Time Interrogation Session: 20161212132104
Implantable Lead Implant Date: 20030625
Implantable Lead Implant Date: 20030625
Implantable Lead Location: 753859
Implantable Lead Model: 5076
Lead Channel Setting Pacing Amplitude: 2.5 V
Lead Channel Setting Pacing Pulse Width: 0.8 ms
Lead Channel Setting Sensing Sensitivity: 2.8 mV
MDC IDC LEAD LOCATION: 753860
MDC IDC MSMT LEADCHNL RV IMPEDANCE VALUE: 560 Ohm

## 2015-04-27 NOTE — Progress Notes (Signed)
In clinic check for battery longevity. Estimated longevity 19 months remaining (range: <2-35 months). No episodes recorded. ROV with device clinic in [place] on 05/22/15 (per pt request) and ROV with JA/[place] in March.

## 2015-05-05 ENCOUNTER — Encounter: Payer: Self-pay | Admitting: Internal Medicine

## 2015-05-22 ENCOUNTER — Ambulatory Visit (INDEPENDENT_AMBULATORY_CARE_PROVIDER_SITE_OTHER): Payer: Commercial Managed Care - HMO | Admitting: *Deleted

## 2015-05-22 DIAGNOSIS — I482 Chronic atrial fibrillation: Secondary | ICD-10-CM | POA: Diagnosis not present

## 2015-05-22 DIAGNOSIS — I4821 Permanent atrial fibrillation: Secondary | ICD-10-CM

## 2015-05-22 DIAGNOSIS — Z950 Presence of cardiac pacemaker: Secondary | ICD-10-CM | POA: Diagnosis not present

## 2015-05-22 LAB — CUP PACEART INCLINIC DEVICE CHECK
Battery Remaining Longevity: 11 mo
Brady Statistic RV Percent Paced: 14.3 %
Date Time Interrogation Session: 20170106154621
Implantable Lead Implant Date: 20030625
Implantable Lead Location: 753860
Lead Channel Pacing Threshold Amplitude: 1 V
Lead Channel Pacing Threshold Pulse Width: 0.8 ms
Lead Channel Setting Pacing Amplitude: 2.5 V
Lead Channel Setting Sensing Sensitivity: 2.8 mV
MDC IDC LEAD IMPLANT DT: 20030625
MDC IDC LEAD LOCATION: 753859
MDC IDC MSMT LEADCHNL RV IMPEDANCE VALUE: 558 Ohm
MDC IDC MSMT LEADCHNL RV SENSING INTR AMPL: 11.2 mV
MDC IDC SET LEADCHNL RV PACING PULSEWIDTH: 0.8 ms

## 2015-05-22 NOTE — Progress Notes (Signed)
Pacemaker check in clinic. Normal device function. Threshold, sensing, impedance consistent with previous measurements. Device programmed to maximize longevity. No high ventricular rates noted. Device programmed at appropriate safety margins. Histogram distribution appropriate for patient activity level. Device programmed to optimize intrinsic conduction. Estimated longevity <2-35 months (19 month average). Patient enrolled in remote follow-up. ROV with device clinic 06/19/15 for battery check (no charge), ROV with JA in April.

## 2015-06-02 ENCOUNTER — Encounter: Payer: Self-pay | Admitting: Internal Medicine

## 2015-06-19 ENCOUNTER — Ambulatory Visit (INDEPENDENT_AMBULATORY_CARE_PROVIDER_SITE_OTHER): Payer: Commercial Managed Care - HMO | Admitting: *Deleted

## 2015-06-19 ENCOUNTER — Encounter: Payer: Self-pay | Admitting: Internal Medicine

## 2015-06-19 DIAGNOSIS — Z950 Presence of cardiac pacemaker: Secondary | ICD-10-CM

## 2015-06-19 LAB — CUP PACEART INCLINIC DEVICE CHECK
Implantable Lead Implant Date: 20030625
Implantable Lead Location: 753859
Implantable Lead Location: 753860
Implantable Lead Model: 5076
Lead Channel Setting Pacing Amplitude: 2.5 V
Lead Channel Setting Pacing Pulse Width: 0.8 ms
Lead Channel Setting Sensing Sensitivity: 2.8 mV
MDC IDC LEAD IMPLANT DT: 20030625
MDC IDC SESS DTM: 20170203151826

## 2015-06-19 NOTE — Progress Notes (Signed)
Quick check in clinic for battery status. <2-24 months remaining with an average of 18 months. ROV with device clinic 07/17/15 at 3pm for battery check only (no charge) and with JA in April (billable).

## 2015-07-14 ENCOUNTER — Encounter: Payer: Self-pay | Admitting: Internal Medicine

## 2015-07-14 ENCOUNTER — Ambulatory Visit (INDEPENDENT_AMBULATORY_CARE_PROVIDER_SITE_OTHER): Payer: Commercial Managed Care - HMO | Admitting: *Deleted

## 2015-07-14 DIAGNOSIS — Z950 Presence of cardiac pacemaker: Secondary | ICD-10-CM

## 2015-07-14 LAB — CUP PACEART INCLINIC DEVICE CHECK
Date Time Interrogation Session: 20170228150549
Implantable Lead Implant Date: 20030625
Implantable Lead Implant Date: 20030625
Implantable Lead Location: 753859
Implantable Lead Model: 5076
MDC IDC LEAD LOCATION: 753860

## 2015-07-14 NOTE — Progress Notes (Signed)
Pacemaker check in clinic for battery only. <1-32 months remaining with an average of 17 months. No episodes. ROV with JA/E 08/21/15.

## 2015-08-21 ENCOUNTER — Ambulatory Visit (INDEPENDENT_AMBULATORY_CARE_PROVIDER_SITE_OTHER): Payer: Commercial Managed Care - HMO | Admitting: Internal Medicine

## 2015-08-21 ENCOUNTER — Encounter: Payer: Self-pay | Admitting: Internal Medicine

## 2015-08-21 ENCOUNTER — Encounter: Payer: Self-pay | Admitting: *Deleted

## 2015-08-21 ENCOUNTER — Telehealth: Payer: Self-pay | Admitting: Internal Medicine

## 2015-08-21 VITALS — BP 130/78 | HR 74 | Ht 69.0 in | Wt 196.0 lb

## 2015-08-21 DIAGNOSIS — I4891 Unspecified atrial fibrillation: Secondary | ICD-10-CM | POA: Diagnosis not present

## 2015-08-21 DIAGNOSIS — Z950 Presence of cardiac pacemaker: Secondary | ICD-10-CM

## 2015-08-21 LAB — CUP PACEART INCLINIC DEVICE CHECK
Date Time Interrogation Session: 20170407131531
Implantable Lead Implant Date: 20030625
Implantable Lead Implant Date: 20030625
Implantable Lead Location: 753859
Lead Channel Pacing Threshold Amplitude: 1 V
Lead Channel Pacing Threshold Pulse Width: 0.8 ms
MDC IDC LEAD LOCATION: 753860
MDC IDC MSMT LEADCHNL RV SENSING INTR AMPL: 11.2 mV
MDC IDC STAT BRADY RV PERCENT PACED: 16.7 %

## 2015-08-21 NOTE — Patient Instructions (Signed)
Your physician recommends that you continue on your current medications as directed. Please refer to the Current Medication list given to you today. Device clinic follow up in 3 months. Please schedule this appointment today. Your physician has requested that you have a TEE. During a TEE, sound waves are used to create images of your heart. It provides your doctor with information about the size and shape of your heart and how well your heart's chambers and valves are working. In this test, a transducer is attached to the end of a flexible tube that's guided down your throat and into your esophagus (the tube leading from you mouth to your stomach) to get a more detailed image of your heart. You are not awake for the procedure. Please see the instruction sheet given to you today. For further information please visit [email or web address]. Your physician recommends that you schedule a follow-up appointment in: 1 year with Dr. Rayann Heman. Please schedule this appointment today.

## 2015-08-21 NOTE — Telephone Encounter (Signed)
TEE with Dr. Debara Pickett at [facility] on Tuesday, September 01, 2015 @9 :00 am dx: evaluate LAA for [employer]

## 2015-08-21 NOTE — Progress Notes (Signed)
Electrophysiology Office Note   Date:  08/21/2015   ID:  Clifford [name], DOB 1940-11-26, MRN [id number]  PCP:  Manon Hilding, MD  Cardiologist:  Dr Bronson Ing Primary Electrophysiologist: Thompson Grayer, MD    Chief Complaint  Patient presents with  . Atrial Fibrillation     History of Present Illness: Clifford [name] is a 75 y.o. male who presents today for electrophysiology evaluation.  He has permanent afib for which he is asymptomatic.  He denies exertional symptoms.  He is unsteady at times.  Compliance with anticoagulation has been poor.  This is largely due to fear of GI bleeding (he has had prior GI bleeding).  Dr Bronson Ing has worked diligently with him on anticoagulation without success (11/16 note reviewed with Dr Bronson Ing today). Today, he denies symptoms of palpitations, chest pain, shortness of breath, orthopnea, PND, lower extremity edema, claudication, dizziness, presyncope, syncope, or neurologic sequela. The patient is tolerating medications without difficulties and is otherwise without complaint today.    Past Medical History  Diagnosis Date  . Persistent atrial fibrillation ([place])   . Hypertensive cardiovascular disease   . Rheumatoid arthritis(714.0)   . CAD (coronary artery disease)   . Goiter   . Stroke ([place]) 10/15    left sided weakness  . Shortness of breath dyspnea     exertion  . Sick sinus syndrome ([place])   . Type II or unspecified type diabetes mellitus without mention of complication, not stated as uncontrolled     fasting cbg 150-180  . GERD (gastroesophageal reflux disease)   . Medically noncompliant    Past Surgical History  Procedure Laterality Date  . Pacemaker insertion      MDT sigma  . Back surgery    . Rotator cuff repair Bilateral   . Cardiac catheterization    . Coronary angioplasty    . Thyroidectomy  06/12/2014  . Thyroidectomy N/A 06/12/2014    Procedure: TOTAL THYROIDECTOMY;  Surgeon: Armandina Gemma, MD;  Location: [place] [facility];  Service:  General;  Laterality: N/A;     Current Outpatient Prescriptions  Medication Sig Dispense Refill  . aspirin EC 81 MG tablet Take 81 mg by mouth daily.    . folic acid (FOLVITE) 1 MG tablet Take 1 mg by mouth daily.    Marland Kitchen glipiZIDE (GLUCOTROL) 10 MG tablet Take 10 mg by mouth daily.     . hydrochlorothiazide (HYDRODIURIL) 25 MG tablet Take 25 mg by mouth daily.    Marland Kitchen HYDROcodone-acetaminophen (NORCO/VICODIN) 5-325 MG per tablet Take 1-2 tablets by mouth every 4 (four) hours as needed for moderate pain. 30 tablet 0  . levothyroxine (SYNTHROID) 100 MCG tablet Take 1 tablet (100 mcg total) by mouth daily before breakfast. 30 tablet 3  . lisinopril (PRINIVIL,ZESTRIL) 20 MG tablet Take 20 mg by mouth daily.    Marland Kitchen lovastatin (MEVACOR) 40 MG tablet Take 60 mg by mouth at bedtime.     . metFORMIN (GLUCOPHAGE) 500 MG tablet Take 1,000 mg by mouth 2 (two) times daily with a meal.     . methotrexate (RHEUMATREX) 2.5 MG tablet Take 10 mg by mouth once a week. On Mondays- Caution:Chemotherapy. Protect from light.    . nitroGLYCERIN (NITROSTAT) 0.4 MG SL tablet Place 1 tablet (0.4 mg total) under the tongue every 5 (five) minutes as needed for chest pain (MAX 3 TABLETS). 25 tablet 3  . ranitidine (ZANTAC) 150 MG tablet Take 150 mg by mouth daily as needed for heartburn.     Marland Kitchen  TOUJEO SOLOSTAR 300 UNIT/ML SOPN Inject 40 Units into the skin daily.     No current facility-administered medications for this visit.    Allergies:   Demerol   Social History:  The patient  reports that he quit smoking about 20 years ago. His smoking use included Cigarettes. He started smoking about 70 years ago. He has a 100 pack-year smoking history. He has never used smokeless tobacco. He reports that he does not drink alcohol or use illicit drugs.   Family History:  The patient's family history includes Alcoholism in his brother and brother; Cancer in his brother, brother, daughter, and paternal grandfather; Diabetes in his mother.     ROS:  Please see the history of present illness.   All other systems are reviewed and negative.    PHYSICAL EXAM: VS:  BP 130/78 mmHg  Pulse 74  Ht 5\' 9"  (1.753 m)  Wt 196 lb (88.905 kg)  BMI 28.93 kg/m2  SpO2 99% , BMI Body mass index is 28.93 kg/(m^2). GEN: Well nourished, well developed, in no acute distress HEENT: normal Neck: no JVD, carotid bruits, or masses Cardiac: iRRR; no murmurs, rubs, or gallops,no edema  Respiratory:  clear to auscultation bilaterally, normal work of breathing GI: soft, nontender, nondistended, + BS MS: no deformity or atrophy Skin: warm and dry, device pocket is well healed Neuro:  Strength and sensation are intact Psych: euthymic mood, full affect  Device interrogation is reviewed today in detail.  See PaceArt for details.   Recent Labs: No results found for requested labs within last 365 days.    Lipid Panel     Component Value Date/Time   CHOL 153 02/09/2014 0556   TRIG 139 02/09/2014 0556   HDL 38* 02/09/2014 0556   CHOLHDL 4.0 02/09/2014 0556   VLDL 28 02/09/2014 0556   LDLCALC 87 02/09/2014 0556     Wt Readings from Last 3 Encounters:  08/21/15 196 lb (88.905 kg)  03/24/15 194 lb (87.998 kg)  08/11/14 207 lb 12.8 oz (94.257 kg)      Other studies Reviewed: Additional studies/ records that were reviewed today include: Dr Raylene Everts notes, my notes from last year   ASSESSMENT AND PLAN:  1.  Atrial fibrillation He now likely has permanent afib.  He is asymptomatic and well rate controlled.  He is programmed VVIR chads2vasc score is at least 6. This patients CHA2DS2-VASc Score and unadjusted Ischemic Stroke Rate (% per year) is equal to 9.7 % stroke rate/year from a score of 6.  The importance of stroke prevention was discussed today. Unfortunately, He is not felt to be a long term Warfarin candidate secondary to compliance and prior GI bleeding.  The patients chart has been reviewed and I along with Dr Bronson Ing feel  that they would be a candidate for short term oral anticoagulation.  Procedural risks for the Watchman implant have been reviewed with the patient including a 1% risk of stroke, 2% risk of perforation, 0.1% risk of device embolization.  Given the patient's poor candidacy for long-term oral anticoagulation, ability to tolerate short term oral anticoagulation, I have recommended the watchman left atrial appendage closure system.  TEE will be scheduled to review LAA anatomy.  The patient understands that the ability to implant Watchman is dependent on results of the TEE.  If patient is candidate for Watchman based on TEE results, we will schedule the procedure at the next available time.  He will be scheduled for TEE with Dr Debara Pickett in the  next few weeks.  2. Sick sinus syndrome Normal pacemaker function See Pace Art report No changes today  3. Hypertensive cardiovascular disease Stable No change required today  Return every 3 months for device interrogation by EP nurse (given battery longevity) Return to see me in 1 year  Current medicines are reviewed at length with the patient today.   The patient does not have concerns regarding his medicines.  The following changes were made today:  none  Labs/ tests ordered today include:  No orders of the defined types were placed in this encounter.    Army Fossa, MD  08/21/2015 10:25 AM     [place] [address] [place] [place] [address] [phone] (office) [phone] (fax)

## 2015-09-01 ENCOUNTER — Ambulatory Visit (HOSPITAL_COMMUNITY)
Admission: RE | Admit: 2015-09-01 | Payer: Commercial Managed Care - HMO | Source: Ambulatory Visit | Admitting: Internal Medicine

## 2015-09-01 ENCOUNTER — Encounter (HOSPITAL_COMMUNITY)
Admission: RE | Disposition: A | Discharge status: Home / Self Care | Payer: Self-pay | Source: Ambulatory Visit | Attending: Internal Medicine

## 2015-09-01 ENCOUNTER — Ambulatory Visit (HOSPITAL_COMMUNITY)
Admission: RE | Admit: 2015-09-01 | Discharge: 2015-09-01 | Disposition: A | Discharge status: Home / Self Care | Payer: Commercial Managed Care - HMO | Source: Ambulatory Visit | Attending: Internal Medicine | Admitting: Internal Medicine

## 2015-09-01 ENCOUNTER — Encounter (HOSPITAL_COMMUNITY): Payer: Self-pay | Admitting: *Deleted

## 2015-09-01 DIAGNOSIS — M069 Rheumatoid arthritis, unspecified: Secondary | ICD-10-CM | POA: Insufficient documentation

## 2015-09-01 DIAGNOSIS — Z7982 Long term (current) use of aspirin: Secondary | ICD-10-CM | POA: Diagnosis not present

## 2015-09-01 DIAGNOSIS — I69354 Hemiplegia and hemiparesis following cerebral infarction affecting left non-dominant side: Secondary | ICD-10-CM | POA: Diagnosis not present

## 2015-09-01 DIAGNOSIS — Z79899 Other long term (current) drug therapy: Secondary | ICD-10-CM | POA: Insufficient documentation

## 2015-09-01 DIAGNOSIS — Z87891 Personal history of nicotine dependence: Secondary | ICD-10-CM | POA: Diagnosis not present

## 2015-09-01 DIAGNOSIS — I482 Chronic atrial fibrillation: Secondary | ICD-10-CM | POA: Insufficient documentation

## 2015-09-01 DIAGNOSIS — Z794 Long term (current) use of insulin: Secondary | ICD-10-CM | POA: Diagnosis not present

## 2015-09-01 DIAGNOSIS — I481 Persistent atrial fibrillation: Secondary | ICD-10-CM

## 2015-09-01 DIAGNOSIS — K219 Gastro-esophageal reflux disease without esophagitis: Secondary | ICD-10-CM | POA: Insufficient documentation

## 2015-09-01 DIAGNOSIS — E119 Type 2 diabetes mellitus without complications: Secondary | ICD-10-CM | POA: Diagnosis not present

## 2015-09-01 DIAGNOSIS — Z950 Presence of cardiac pacemaker: Secondary | ICD-10-CM | POA: Insufficient documentation

## 2015-09-01 DIAGNOSIS — I4819 Other persistent atrial fibrillation: Secondary | ICD-10-CM | POA: Insufficient documentation

## 2015-09-01 DIAGNOSIS — I119 Hypertensive heart disease without heart failure: Secondary | ICD-10-CM | POA: Insufficient documentation

## 2015-09-01 DIAGNOSIS — I4891 Unspecified atrial fibrillation: Secondary | ICD-10-CM | POA: Diagnosis present

## 2015-09-01 DIAGNOSIS — I2510 Atherosclerotic heart disease of native coronary artery without angina pectoris: Secondary | ICD-10-CM | POA: Insufficient documentation

## 2015-09-01 DIAGNOSIS — I495 Sick sinus syndrome: Secondary | ICD-10-CM | POA: Diagnosis not present

## 2015-09-01 LAB — GLUCOSE, CAPILLARY: Glucose-Capillary: 138 mg/dL — ABNORMAL HIGH (ref 65–99)

## 2015-09-01 SURGERY — INVASIVE LAB ABORTED CASE

## 2015-09-01 MED ORDER — LIDOCAINE VISCOUS 2 % MT SOLN
OROMUCOSAL | Status: AC
  Filled 2015-09-01: qty 15

## 2015-09-01 MED ORDER — FENTANYL CITRATE (PF) 100 MCG/2ML IJ SOLN
INTRAMUSCULAR | Status: DC | PRN
  Administered 2015-09-01: 25 ug via INTRAVENOUS
  Administered 2015-09-01: 12.5 ug via INTRAVENOUS
  Administered 2015-09-01: 25 ug via INTRAVENOUS

## 2015-09-01 MED ORDER — BUTAMBEN-TETRACAINE-BENZOCAINE 2-2-14 % EX AERO
INHALATION_SPRAY | CUTANEOUS | Status: DC | PRN
  Administered 2015-09-01: 2 via TOPICAL

## 2015-09-01 MED ORDER — SODIUM CHLORIDE 0.9 % IV SOLN
INTRAVENOUS | Status: DC
  Administered 2015-09-01: 500 mL via INTRAVENOUS

## 2015-09-01 MED ORDER — FENTANYL CITRATE (PF) 100 MCG/2ML IJ SOLN
INTRAMUSCULAR | Status: AC
  Filled 2015-09-01: qty 2

## 2015-09-01 MED ORDER — MIDAZOLAM HCL 5 MG/ML IJ SOLN
INTRAMUSCULAR | Status: AC
  Filled 2015-09-01: qty 2

## 2015-09-01 MED ORDER — LIDOCAINE VISCOUS 2 % MT SOLN
OROMUCOSAL | Status: DC | PRN
  Administered 2015-09-01: 10 mL via OROMUCOSAL

## 2015-09-01 MED ORDER — MIDAZOLAM HCL 10 MG/2ML IJ SOLN
INTRAMUSCULAR | Status: DC | PRN
  Administered 2015-09-01: 1 mg via INTRAVENOUS
  Administered 2015-09-01: 2 mg via INTRAVENOUS
  Administered 2015-09-01 (×2): 1 mg via INTRAVENOUS
  Administered 2015-09-01: 2 mg via INTRAVENOUS

## 2015-09-01 NOTE — Discharge Instructions (Signed)
Moderate Conscious Sedation, Adult, Care After °Refer to this sheet in the next few weeks. These instructions provide you with information on caring for yourself after your procedure. Your health care provider may also give you more specific instructions. Your treatment has been planned according to current medical practices, but problems sometimes occur. Call your health care provider if you have any problems or questions after your procedure. °WHAT TO EXPECT AFTER THE PROCEDURE  °After your procedure: °· You may feel sleepy, clumsy, and have poor balance for several hours. °· Vomiting may occur if you eat too soon after the procedure. °HOME CARE INSTRUCTIONS °· Do not participate in any activities where you could become injured for at least 24 hours. Do not: °¨ Drive. °¨ Swim. °¨ Ride a bicycle. °¨ Operate heavy machinery. °¨ Cook. °¨ Use power tools. °¨ Climb ladders. °¨ Work from a high place. °· Do not make important decisions or sign legal documents until you are improved. °· If you vomit, drink water, juice, or soup when you can drink without vomiting. Make sure you have little or no nausea before eating solid foods. °· Only take over-the-counter or prescription medicines for pain, discomfort, or fever as directed by your health care provider. °· Make sure you and your family fully understand everything about the medicines given to you, including what side effects may occur. °· You should not drink alcohol, take sleeping pills, or take medicines that cause drowsiness for at least 24 hours. °· If you smoke, do not smoke without supervision. °· If you are feeling better, you may resume normal activities 24 hours after you were sedated. °· Keep all appointments with your health care provider. °SEEK MEDICAL CARE IF: °· Your skin is pale or bluish in color. °· You continue to feel nauseous or vomit. °· Your pain is getting worse and is not helped by medicine. °· You have bleeding or swelling. °· You are still  sleepy or feeling clumsy after 24 hours. °SEEK IMMEDIATE MEDICAL CARE IF: °· You develop a rash. °· You have difficulty breathing. °· You develop any type of allergic problem. °· You have a fever. °MAKE SURE YOU: °· Understand these instructions. °· Will watch your condition. °· Will get help right away if you are not doing well or get worse. °  °This information is not intended to replace advice given to you by your health care provider. Make sure you discuss any questions you have with your health care provider. °  °Document Released: 02/20/2013 Document Revised: 05/23/2014 Document Reviewed: 02/20/2013 °Elsevier Interactive Patient Education ©2016 Else[employer] Inc. ° ° °

## 2015-09-01 NOTE — H&P (Signed)
     INTERVAL PROCEDURE H&P  History and Physical Interval Note:  09/01/2015 8:12 AM  Clifford [name] has presented today for their planned procedure. The various methods of treatment have been discussed with the patient and family. After consideration of risks, benefits and other options for treatment, the patient has consented to the procedure.  The patients' outpatient history has been reviewed, patient examined, and no change in status from most recent office note within the past 30 days. I have reviewed the patients' chart and labs and will proceed as planned. Questions were answered to the patient's satisfaction.   Pixie Casino, MD, [facility] Attending Cardiologist [facility] 09/01/2015, 8:12 AM

## 2015-09-01 NOTE — CV Procedure (Signed)
Sedated patient for pre-Watchman TEE. Although he appeared sedated, while trying to pass the tube he became very agitated and combative. It was felt that continuing the procedure would be unsafe. Anesthesia was called for support, however, they were not available for add-on. The patient will need to be re-scheduled with anesthesia support for a deeper level of sedation. During this procedure the patient is administered a total of Versed 7 mg and Fentanyl 62.5 mcg to achieve and maintain moderate conscious sedation.  The patient's heart rate, blood pressure, and oxygen saturation are monitored continuously during the procedure. The period of conscious sedation is 13 minutes, of which I was present face-to-face 100% of this time.  Pixie Casino, MD, [facility] Attending Cardiologist [place]

## 2015-09-04 ENCOUNTER — Telehealth: Payer: Self-pay | Admitting: Internal Medicine

## 2015-09-04 NOTE — Telephone Encounter (Signed)
Message sent to provider for advice

## 2015-09-04 NOTE — Telephone Encounter (Signed)
Patient would like to know if test will be rescheduled since he was not able to perform the test due to sedation didn't work Will be home in about 45 minutes

## 2015-09-04 NOTE — Telephone Encounter (Signed)
Thompson Grayer, MD   Sent: Fri September 04, 2015 3:10 PM    To: Merlene Laughter, LPN    Cc: Patsey Berthold, NP        Message     Thanks Alma Friendly.        Yes, I was aware.    Amber Lynnell Jude is out the rest of this week but is planning to follow-up with him early next week. She is our Camera operator.        Thanks!        JA        ----- Message -----     From: Merlene Laughter, LPN     Sent: [id number] 12:45 PM      To: Thompson Grayer, MD        Do we need to reschedule this now? Patient is calling about having it rescheduled. I didn't know if you were aware that his test was canceled.

## 2015-09-07 NOTE — Telephone Encounter (Signed)
Patient informed. 

## 2015-09-14 ENCOUNTER — Telehealth: Payer: Self-pay | Admitting: Nurse Practitioner

## 2015-09-14 NOTE — Telephone Encounter (Signed)
Attempted to call patient to discuss recent TEE attempt pre-Watchman implant. TEE was unable to be done 2/2 pt unable to be adequately sedated.  Our options are 1- reschedule TEE w anesthesia or 2 - do TEE same day as Watchman scheduled understanding that we do not know LAA anatomy ahead of time  No answer on patient's or DPR's phone number. When I met them in the hospital, they were very clear to not leave a message. Will try to call again later today.  [name], NP 09/14/2015 11:26 AM

## 2015-09-15 NOTE — Telephone Encounter (Signed)
Spoke with patient while he was in [place] office. I have been unable to reach him by phone. Discussed options. He would like to proceed with TEE and Watchman same day.  He no longer has a transportation option with his brother and his daughter would need to bring him.  He gave me verbal permission to speak with his daughter, [name] [phone]) by phone. I have left her a message to call to arrange TEE and Watchman same day (recognizing that if TEE is not favorable then we will not proceed with Watchman).   I have offered Mr Guadagnoli 5/18 and 6/22 as possible options. Would schedule for last case of the day (arrive at 12noon and have procedure scheduled for 2PM).  Advised on message that she could speak with either myself or Janan Halter when she calls back.   I also asked patient to sign DPR while in [place] office regarding speaking with his daughter about his care  [name], NP 09/15/2015 12:13 PM

## 2015-09-16 ENCOUNTER — Telehealth: Payer: Self-pay | Admitting: Internal Medicine

## 2015-09-16 NOTE — Telephone Encounter (Addendum)
6/22 is all that is open now. Would schedule for last case of the day (arrive at 12noon and have procedure scheduled for 2PM).  Arrive at [place] at 12pm NPO after MN No medications the am of test

## 2015-09-16 NOTE — Telephone Encounter (Signed)
Clifford [name] is returning a call to [employer] . Please call   Thanks

## 2015-09-16 NOTE — Telephone Encounter (Signed)
Follow Up- Returning Call   Ruth(daughter) is calling to discuss a procedure for father.

## 2015-11-05 ENCOUNTER — Encounter (HOSPITAL_COMMUNITY)
Admission: RE | Disposition: A | Discharge status: Skilled Nursing Facility | Payer: Self-pay | Source: Ambulatory Visit | Attending: Cardiovascular Disease

## 2015-11-05 ENCOUNTER — Inpatient Hospital Stay (HOSPITAL_COMMUNITY): Payer: Commercial Managed Care - HMO

## 2015-11-05 ENCOUNTER — Encounter (HOSPITAL_COMMUNITY): Payer: Self-pay | Admitting: General Practice

## 2015-11-05 ENCOUNTER — Inpatient Hospital Stay (HOSPITAL_COMMUNITY): Payer: Commercial Managed Care - HMO | Admitting: Anesthesiology

## 2015-11-05 ENCOUNTER — Inpatient Hospital Stay (HOSPITAL_COMMUNITY)
Admission: RE | Admit: 2015-11-05 | Discharge: 2015-11-10 | DRG: 264 | Disposition: A | Discharge status: Skilled Nursing Facility | Payer: Commercial Managed Care - HMO | Source: Ambulatory Visit | Attending: Cardiovascular Disease | Admitting: Cardiovascular Disease

## 2015-11-05 DIAGNOSIS — I495 Sick sinus syndrome: Secondary | ICD-10-CM | POA: Diagnosis present

## 2015-11-05 DIAGNOSIS — G458 Other transient cerebral ischemic attacks and related syndromes: Secondary | ICD-10-CM | POA: Diagnosis not present

## 2015-11-05 DIAGNOSIS — Z8719 Personal history of other diseases of the digestive system: Secondary | ICD-10-CM | POA: Diagnosis not present

## 2015-11-05 DIAGNOSIS — Z79899 Other long term (current) drug therapy: Secondary | ICD-10-CM

## 2015-11-05 DIAGNOSIS — Z006 Encounter for examination for normal comparison and control in clinical research program: Secondary | ICD-10-CM

## 2015-11-05 DIAGNOSIS — Z87891 Personal history of nicotine dependence: Secondary | ICD-10-CM

## 2015-11-05 DIAGNOSIS — E119 Type 2 diabetes mellitus without complications: Secondary | ICD-10-CM | POA: Diagnosis present

## 2015-11-05 DIAGNOSIS — G8191 Hemiplegia, unspecified affecting right dominant side: Secondary | ICD-10-CM | POA: Diagnosis present

## 2015-11-05 DIAGNOSIS — R531 Weakness: Secondary | ICD-10-CM | POA: Insufficient documentation

## 2015-11-05 DIAGNOSIS — R4701 Aphasia: Secondary | ICD-10-CM

## 2015-11-05 DIAGNOSIS — E049 Nontoxic goiter, unspecified: Secondary | ICD-10-CM | POA: Diagnosis present

## 2015-11-05 DIAGNOSIS — I119 Hypertensive heart disease without heart failure: Secondary | ICD-10-CM | POA: Diagnosis present

## 2015-11-05 DIAGNOSIS — I69320 Aphasia following cerebral infarction: Secondary | ICD-10-CM | POA: Diagnosis not present

## 2015-11-05 DIAGNOSIS — Z7901 Long term (current) use of anticoagulants: Secondary | ICD-10-CM | POA: Diagnosis not present

## 2015-11-05 DIAGNOSIS — I482 Chronic atrial fibrillation: Principal | ICD-10-CM | POA: Diagnosis present

## 2015-11-05 DIAGNOSIS — G9389 Other specified disorders of brain: Secondary | ICD-10-CM | POA: Diagnosis present

## 2015-11-05 DIAGNOSIS — R451 Restlessness and agitation: Secondary | ICD-10-CM | POA: Diagnosis not present

## 2015-11-05 DIAGNOSIS — E039 Hypothyroidism, unspecified: Secondary | ICD-10-CM | POA: Diagnosis present

## 2015-11-05 DIAGNOSIS — Z7984 Long term (current) use of oral hypoglycemic drugs: Secondary | ICD-10-CM

## 2015-11-05 DIAGNOSIS — G459 Transient cerebral ischemic attack, unspecified: Secondary | ICD-10-CM | POA: Insufficient documentation

## 2015-11-05 DIAGNOSIS — I351 Nonrheumatic aortic (valve) insufficiency: Secondary | ICD-10-CM | POA: Diagnosis present

## 2015-11-05 DIAGNOSIS — I2510 Atherosclerotic heart disease of native coronary artery without angina pectoris: Secondary | ICD-10-CM | POA: Diagnosis present

## 2015-11-05 DIAGNOSIS — G4089 Other seizures: Secondary | ICD-10-CM | POA: Diagnosis not present

## 2015-11-05 DIAGNOSIS — I481 Persistent atrial fibrillation: Secondary | ICD-10-CM

## 2015-11-05 DIAGNOSIS — I6349 Cerebral infarction due to embolism of other cerebral artery: Secondary | ICD-10-CM | POA: Diagnosis not present

## 2015-11-05 DIAGNOSIS — I4891 Unspecified atrial fibrillation: Secondary | ICD-10-CM | POA: Diagnosis present

## 2015-11-05 DIAGNOSIS — E7800 Pure hypercholesterolemia, unspecified: Secondary | ICD-10-CM | POA: Diagnosis present

## 2015-11-05 DIAGNOSIS — M069 Rheumatoid arthritis, unspecified: Secondary | ICD-10-CM | POA: Diagnosis present

## 2015-11-05 DIAGNOSIS — I6310 Cerebral infarction due to embolism of unspecified precerebral artery: Secondary | ICD-10-CM | POA: Diagnosis not present

## 2015-11-05 DIAGNOSIS — M6289 Other specified disorders of muscle: Secondary | ICD-10-CM | POA: Diagnosis not present

## 2015-11-05 DIAGNOSIS — K219 Gastro-esophageal reflux disease without esophagitis: Secondary | ICD-10-CM | POA: Diagnosis present

## 2015-11-05 DIAGNOSIS — G9340 Encephalopathy, unspecified: Secondary | ICD-10-CM | POA: Diagnosis not present

## 2015-11-05 DIAGNOSIS — Z538 Procedure and treatment not carried out for other reasons: Secondary | ICD-10-CM | POA: Diagnosis present

## 2015-11-05 DIAGNOSIS — Z981 Arthrodesis status: Secondary | ICD-10-CM

## 2015-11-05 DIAGNOSIS — Z9114 Patient's other noncompliance with medication regimen: Secondary | ICD-10-CM

## 2015-11-05 DIAGNOSIS — Z7982 Long term (current) use of aspirin: Secondary | ICD-10-CM

## 2015-11-05 DIAGNOSIS — R4182 Altered mental status, unspecified: Secondary | ICD-10-CM | POA: Diagnosis not present

## 2015-11-05 DIAGNOSIS — I480 Paroxysmal atrial fibrillation: Secondary | ICD-10-CM | POA: Diagnosis not present

## 2015-11-05 DIAGNOSIS — Z950 Presence of cardiac pacemaker: Secondary | ICD-10-CM | POA: Diagnosis not present

## 2015-11-05 DIAGNOSIS — I639 Cerebral infarction, unspecified: Secondary | ICD-10-CM | POA: Insufficient documentation

## 2015-11-05 HISTORY — DX: Hypothyroidism, unspecified: E03.9

## 2015-11-05 HISTORY — DX: Other chronic pain: G89.29

## 2015-11-05 HISTORY — DX: Pure hypercholesterolemia, unspecified: E78.00

## 2015-11-05 HISTORY — DX: Low back pain, unspecified: M54.50

## 2015-11-05 HISTORY — PX: LEFT ATRIAL APPENDAGE OCCLUSION: SHX173A

## 2015-11-05 HISTORY — DX: Low back pain: M54.5

## 2015-11-05 HISTORY — DX: Type 2 diabetes mellitus without complications: E11.9

## 2015-11-05 LAB — POCT ACTIVATED CLOTTING TIME
ACTIVATED CLOTTING TIME: 208 s
ACTIVATED CLOTTING TIME: 213 s
Activated Clotting Time: 153 seconds

## 2015-11-05 LAB — BASIC METABOLIC PANEL
Anion gap: 7 (ref 5–15)
BUN: 18 mg/dL (ref 6–20)
CALCIUM: 9.4 mg/dL (ref 8.9–10.3)
CO2: 25 mmol/L (ref 22–32)
CREATININE: 0.88 mg/dL (ref 0.61–1.24)
Chloride: 107 mmol/L (ref 101–111)
Glucose, Bld: 140 mg/dL — ABNORMAL HIGH (ref 65–99)
Potassium: 3.8 mmol/L (ref 3.5–5.1)
SODIUM: 139 mmol/L (ref 135–145)

## 2015-11-05 LAB — CBC
HCT: 39.3 % (ref 39.0–52.0)
Hemoglobin: 13.5 g/dL (ref 13.0–17.0)
MCH: 28.7 pg (ref 26.0–34.0)
MCHC: 34.4 g/dL (ref 30.0–36.0)
MCV: 83.4 fL (ref 78.0–100.0)
PLATELETS: 215 10*3/uL (ref 150–400)
RBC: 4.71 MIL/uL (ref 4.22–5.81)
RDW: 13.5 % (ref 11.5–15.5)
WBC: 5 10*3/uL (ref 4.0–10.5)

## 2015-11-05 LAB — ABO/RH: ABO/RH(D): A NEG

## 2015-11-05 LAB — TYPE AND SCREEN
ABO/RH(D): A NEG
ANTIBODY SCREEN: NEGATIVE

## 2015-11-05 LAB — GLUCOSE, CAPILLARY
GLUCOSE-CAPILLARY: 138 mg/dL — AB (ref 65–99)
GLUCOSE-CAPILLARY: 125 mg/dL — AB (ref 65–99)
Glucose-Capillary: 286 mg/dL — ABNORMAL HIGH (ref 65–99)

## 2015-11-05 SURGERY — LEFT ATRIAL APPENDAGE OCCLUSION
Anesthesia: General

## 2015-11-05 MED ORDER — HEPARIN SODIUM (PORCINE) 1000 UNIT/ML IJ SOLN
INTRAMUSCULAR | Status: DC | PRN
  Administered 2015-11-05: 2000 [IU] via INTRAVENOUS

## 2015-11-05 MED ORDER — SODIUM CHLORIDE 0.9 % IV SOLN: INTRAVENOUS | Status: DC

## 2015-11-05 MED ORDER — ONDANSETRON HCL 4 MG/2ML IJ SOLN: 4.0000 mg | Freq: Four times a day (QID) | INTRAMUSCULAR | Status: DC | PRN

## 2015-11-05 MED ORDER — HEPARIN SODIUM (PORCINE) 1000 UNIT/ML IJ SOLN
INTRAMUSCULAR | Status: DC | PRN
  Administered 2015-11-05: 3000 [IU] via INTRAVENOUS
  Administered 2015-11-05: 10000 [IU] via INTRAVENOUS
  Administered 2015-11-05: 4000 [IU] via INTRAVENOUS

## 2015-11-05 MED ORDER — LEVOTHYROXINE SODIUM 100 MCG PO TABS
100.0000 ug | ORAL_TABLET | Freq: Every day | ORAL | Status: DC
  Administered 2015-11-07 – 2015-11-10 (×4): 100 ug via ORAL
  Filled 2015-11-05 (×4): qty 1

## 2015-11-05 MED ORDER — PROPOFOL 10 MG/ML IV BOLUS
INTRAVENOUS | Status: DC | PRN
  Administered 2015-11-05: 160 mg via INTRAVENOUS

## 2015-11-05 MED ORDER — LIDOCAINE HCL (PF) 1 % IJ SOLN
INTRAMUSCULAR | Status: AC
  Filled 2015-11-05: qty 30

## 2015-11-05 MED ORDER — HEPARIN SODIUM (PORCINE) 5000 UNIT/ML IJ SOLN
5000.0000 [IU] | Freq: Three times a day (TID) | INTRAMUSCULAR | Status: DC
  Administered 2015-11-06 – 2015-11-08 (×6): 5000 [IU] via SUBCUTANEOUS
  Filled 2015-11-05 (×7): qty 1

## 2015-11-05 MED ORDER — ONDANSETRON HCL 4 MG/2ML IJ SOLN
INTRAMUSCULAR | Status: DC | PRN
  Administered 2015-11-05: 4 mg via INTRAVENOUS

## 2015-11-05 MED ORDER — HEPARIN (PORCINE) IN NACL 2-0.9 UNIT/ML-% IJ SOLN
INTRAMUSCULAR | Status: DC | PRN
  Administered 2015-11-05: 14:00:00

## 2015-11-05 MED ORDER — GLIPIZIDE 5 MG PO TABS
10.0000 mg | ORAL_TABLET | Freq: Every day | ORAL | Status: DC
  Administered 2015-11-07 – 2015-11-10 (×4): 10 mg via ORAL
  Filled 2015-11-05 (×4): qty 2

## 2015-11-05 MED ORDER — LIDOCAINE 2% (20 MG/ML) 5 ML SYRINGE
INTRAMUSCULAR | Status: DC | PRN
  Administered 2015-11-05: 80 mg via INTRAVENOUS

## 2015-11-05 MED ORDER — PROTAMINE SULFATE 10 MG/ML IV SOLN
INTRAVENOUS | Status: DC | PRN
  Administered 2015-11-05 (×3): 10 mg via INTRAVENOUS

## 2015-11-05 MED ORDER — LISINOPRIL 20 MG PO TABS
20.0000 mg | ORAL_TABLET | Freq: Every day | ORAL | Status: DC
  Administered 2015-11-07 – 2015-11-10 (×4): 20 mg via ORAL
  Filled 2015-11-05 (×4): qty 1

## 2015-11-05 MED ORDER — ROCURONIUM BROMIDE 100 MG/10ML IV SOLN
INTRAVENOUS | Status: DC | PRN
  Administered 2015-11-05: 50 mg via INTRAVENOUS

## 2015-11-05 MED ORDER — CEFAZOLIN SODIUM-DEXTROSE 2-4 GM/100ML-% IV SOLN
INTRAVENOUS | Status: AC
  Filled 2015-11-05: qty 100

## 2015-11-05 MED ORDER — ACETAMINOPHEN 325 MG PO TABS
650.0000 mg | ORAL_TABLET | ORAL | Status: DC | PRN
  Administered 2015-11-07: 650 mg via ORAL
  Filled 2015-11-05: qty 2

## 2015-11-05 MED ORDER — CEFAZOLIN SODIUM-DEXTROSE 2-4 GM/100ML-% IV SOLN
2.0000 g | INTRAVENOUS | Status: AC
  Administered 2015-11-05: 2 g via INTRAVENOUS
  Filled 2015-11-05: qty 100

## 2015-11-05 MED ORDER — NITROGLYCERIN 0.4 MG SL SUBL: 0.4000 mg | SUBLINGUAL_TABLET | SUBLINGUAL | Status: DC | PRN

## 2015-11-05 MED ORDER — IOPAMIDOL (ISOVUE-370) INJECTION 76%
INTRAVENOUS | Status: DC | PRN
  Administered 2015-11-05: 65 mL via INTRAVENOUS

## 2015-11-05 MED ORDER — FENTANYL CITRATE (PF) 250 MCG/5ML IJ SOLN
INTRAMUSCULAR | Status: DC | PRN
  Administered 2015-11-05: 100 ug via INTRAVENOUS

## 2015-11-05 MED ORDER — HEPARIN SODIUM (PORCINE) 1000 UNIT/ML IJ SOLN
INTRAMUSCULAR | Status: AC
  Filled 2015-11-05: qty 1

## 2015-11-05 MED ORDER — SODIUM CHLORIDE 0.9% FLUSH: 3.0000 mL | INTRAVENOUS | Status: DC | PRN

## 2015-11-05 MED ORDER — LACTATED RINGERS IV SOLN
INTRAVENOUS | Status: DC | PRN
  Administered 2015-11-05: 14:00:00 via INTRAVENOUS

## 2015-11-05 MED ORDER — IOPAMIDOL (ISOVUE-370) INJECTION 76%
INTRAVENOUS | Status: AC
  Filled 2015-11-05: qty 100

## 2015-11-05 MED ORDER — PRAVASTATIN SODIUM 40 MG PO TABS
40.0000 mg | ORAL_TABLET | Freq: Every day | ORAL | Status: DC
  Administered 2015-11-05 – 2015-11-09 (×5): 40 mg via ORAL
  Filled 2015-11-05 (×5): qty 1

## 2015-11-05 MED ORDER — FAMOTIDINE 20 MG PO TABS
20.0000 mg | ORAL_TABLET | Freq: Every day | ORAL | Status: DC
  Administered 2015-11-05 – 2015-11-10 (×5): 20 mg via ORAL
  Filled 2015-11-05 (×5): qty 1

## 2015-11-05 MED ORDER — SUGAMMADEX SODIUM 200 MG/2ML IV SOLN
INTRAVENOUS | Status: DC | PRN
  Administered 2015-11-05: 180 mg via INTRAVENOUS

## 2015-11-05 MED ORDER — SODIUM CHLORIDE 0.9% FLUSH
3.0000 mL | Freq: Two times a day (BID) | INTRAVENOUS | Status: DC
  Administered 2015-11-05 – 2015-11-09 (×4): 3 mL via INTRAVENOUS

## 2015-11-05 MED ORDER — OFF THE BEAT BOOK
Freq: Once | Status: AC
  Administered 2015-11-05: 21:00:00
  Filled 2015-11-05: qty 1

## 2015-11-05 MED ORDER — ASPIRIN EC 81 MG PO TBEC
81.0000 mg | DELAYED_RELEASE_TABLET | Freq: Every day | ORAL | Status: DC
  Administered 2015-11-05 – 2015-11-10 (×5): 81 mg via ORAL
  Filled 2015-11-05 (×5): qty 1

## 2015-11-05 MED ORDER — SODIUM CHLORIDE 0.9 % IV SOLN: 250.0000 mL | INTRAVENOUS | Status: DC | PRN

## 2015-11-05 MED ORDER — PHENYLEPHRINE HCL 10 MG/ML IJ SOLN
10.0000 mg | INTRAVENOUS | Status: DC | PRN
  Administered 2015-11-05: 50 ug/min via INTRAVENOUS

## 2015-11-05 MED ORDER — FOLIC ACID 1 MG PO TABS
1.0000 mg | ORAL_TABLET | Freq: Every day | ORAL | Status: DC
  Administered 2015-11-05 – 2015-11-10 (×5): 1 mg via ORAL
  Filled 2015-11-05 (×5): qty 1

## 2015-11-05 MED ORDER — INSULIN ASPART 100 UNIT/ML ~~LOC~~ SOLN
0.0000 [IU] | Freq: Three times a day (TID) | SUBCUTANEOUS | Status: DC
  Administered 2015-11-07 – 2015-11-08 (×3): 2 [IU] via SUBCUTANEOUS
  Administered 2015-11-08 – 2015-11-09 (×2): 3 [IU] via SUBCUTANEOUS
  Administered 2015-11-09: 5 [IU] via SUBCUTANEOUS
  Administered 2015-11-10: 2 [IU] via SUBCUTANEOUS
  Administered 2015-11-10: 3 [IU] via SUBCUTANEOUS

## 2015-11-05 MED ORDER — SODIUM CHLORIDE 0.9 % IV SOLN: INTRAVENOUS | Status: AC

## 2015-11-05 MED ORDER — SODIUM CHLORIDE 0.45 % IV SOLN: INTRAVENOUS | Status: DC

## 2015-11-05 MED ORDER — LIDOCAINE HCL (PF) 1 % IJ SOLN
INTRAMUSCULAR | Status: DC | PRN
  Administered 2015-11-05: 10 mL

## 2015-11-05 MED ORDER — HEPARIN (PORCINE) IN NACL 2-0.9 UNIT/ML-% IJ SOLN
INTRAMUSCULAR | Status: AC
  Filled 2015-11-05: qty 1000

## 2015-11-05 MED ORDER — HYDROCHLOROTHIAZIDE 25 MG PO TABS
25.0000 mg | ORAL_TABLET | Freq: Every day | ORAL | Status: DC
  Administered 2015-11-07 – 2015-11-10 (×4): 25 mg via ORAL
  Filled 2015-11-05 (×4): qty 1

## 2015-11-05 SURGICAL SUPPLY — 17 items
BAG SNAP BAND KOVER 36X36 (MISCELLANEOUS) ×3 IMPLANT
BLANKET WARM UNDERBOD FULL ACC (MISCELLANEOUS) ×3 IMPLANT
CATH DIAG 6FR PIGTAIL (CATHETERS) ×6 IMPLANT
KIT HEART LEFT (KITS) ×3 IMPLANT
NDL TRANSEP BRK 71CM 407200 (NEEDLE) IMPLANT
NEEDLE TRANSEP BRK 71CM 407200 (NEEDLE) ×3 IMPLANT
PACK CARDIAC CATHETERIZATION (CUSTOM PROCEDURE TRAY) ×2 IMPLANT
PAD DEFIB LIFELINK (PAD) ×3 IMPLANT
SHEATH INTRO CHECKFLO 16F 13 (SHEATH) ×1 IMPLANT
SHEATH INTRO CHECKFLO 16F 13CM (SHEATH) ×2
SHEATH PINNACLE 8F 10CM (SHEATH) ×3 IMPLANT
SHEATH SWARTZ TS SL2 63CM 8.5F (SHEATH) ×2 IMPLANT
TRANSDUCER W/STOPCOCK (MISCELLANEOUS) ×2 IMPLANT
TUBING CIL FLEX 10 FLL-RA (TUBING) ×3 IMPLANT
WATCHMAN ACCESS ANTERIOR CURVE (SHEATH) ×4 IMPLANT
WIRE AMPLATZ WHISKJ .035X260CM (WIRE) ×2 IMPLANT
WIRE MAILMAN 182CM (WIRE) ×2 IMPLANT

## 2015-11-05 NOTE — H&P (Signed)
Chief Complaint  Patient presents with  . Atrial Fibrillation    History of Present Illness: Clifford [name] is a 75 y.o. male who presents today for left atrial appendage occlusive procedure. He has permanent afib for which he is asymptomatic. He denies exertional symptoms. He is unsteady at times. Compliance with anticoagulation has been poor. This is largely due to fear of GI bleeding (he has had prior GI bleeding). Dr Bronson Ing has worked diligently with him on anticoagulation without success. Today, he denies symptoms of palpitations, chest pain, shortness of breath, orthopnea, PND, lower extremity edema, claudication, dizziness, presyncope, syncope, or neurologic sequela. The patient is tolerating medications without difficulties and is otherwise without complaint today.    Past Medical History  Diagnosis Date  . Persistent atrial fibrillation ([place])   . Hypertensive cardiovascular disease   . Rheumatoid arthritis(714.0)   . CAD (coronary artery disease)   . Goiter   . Stroke ([place]) 10/15    left sided weakness  . Shortness of breath dyspnea     exertion  . Sick sinus syndrome ([place])   . Type II or unspecified type diabetes mellitus without mention of complication, not stated as uncontrolled     fasting cbg 150-180  . GERD (gastroesophageal reflux disease)   . Medically noncompliant    Past Surgical History  Procedure Laterality Date  . Pacemaker insertion      MDT sigma  . Back surgery    . Rotator cuff repair Bilateral   . Cardiac catheterization    . Coronary angioplasty    . Thyroidectomy  06/12/2014  . Thyroidectomy N/A 06/12/2014    Procedure: TOTAL THYROIDECTOMY; Surgeon: Armandina Gemma, MD; Location: [facility]; Service: General; Laterality: N/A;      Home medicines reviewed  Allergies: Demerol   Social History: The patient  reports that he quit smoking about 20  years ago. His smoking use included Cigarettes. He started smoking about 70 years ago. He has a 100 pack-year smoking history. He has never used smokeless tobacco. He reports that he does not drink alcohol or use illicit drugs.   Family History: The patient's family history includes Alcoholism in his brother and brother; Cancer in his brother, brother, daughter, and paternal grandfather; Diabetes in his mother.    ROS: Please see the history of present illness. All other systems are reviewed and negative.    PHYSICAL EXAM: Vitals pending GEN: Well nourished, well developed, in no acute distress  HEENT: normal  Neck: no JVD, carotid bruits, or masses Cardiac: iRRR; no murmurs, rubs, or gallops,no edema  Respiratory: clear to auscultation bilaterally, normal work of breathing GI: soft, nontender, nondistended, + BS MS: no deformity or atrophy  Skin: warm and dry, device pocket is well healed Neuro: Strength and sensation are intact Psych: euthymic mood, full affect   Labs pending    Other studies Reviewed: Additional studies/ records that were reviewed today include: Dr Raylene Everts notes, my notes from last year   ASSESSMENT AND PLAN:  1. Atrial fibrillation He now likely has permanent afib. He is asymptomatic and well rate controlled. He is programmed VVIR chads2vasc score is at least 6. This patients CHA2DS2-VASc Score and unadjusted Ischemic Stroke Rate (% per year) is equal to 9.7 % stroke rate/year from a score of 6. The importance of stroke prevention was discussed today with patient and his daughter. Unfortunately, He is not felt to be a long term Warfarin candidate secondary to compliance and prior GI bleeding. The patients  chart has been reviewed and I along with Dr Bronson Ing feel that they would be a candidate for short term oral anticoagulation. Procedural risks for the Watchman implant have been reviewed with the patient including a 1% risk of stroke, 2% risk  of perforation, 0.1% risk of device embolization. Given the patient's poor candidacy for long-term oral anticoagulation, ability to tolerate short term oral anticoagulation, I have recommended the watchman left atrial appendage closure system.   The patient understands risks and wishes to proceed at this time.  Will plan to start eliquis after the procedure.  2. Sick sinus syndrome Will interrogate device with procedure  3. Hypertensive cardiovascular disease Stable No change required today  Thompson Grayer MD, [facility] 11/05/2015 12:39 PM

## 2015-11-05 NOTE — Progress Notes (Signed)
  Echocardiogram Echocardiogram Transesophageal has been performed.  Clifford [name] 11/05/2015, 4:32 PM

## 2015-11-05 NOTE — Anesthesia Preprocedure Evaluation (Addendum)
Anesthesia Evaluation  Patient identified by MRN, date of birth, ID band Patient confused    Reviewed: Allergy & Precautions, NPO status , Patient's Chart, lab work & pertinent test results  History of Anesthesia Complications Negative for: history of anesthetic complications  Airway Mallampati: II  TM Distance: >3 FB   Mouth opening: Limited Mouth Opening  Dental no notable dental hx. (+) Dental Advisory Given, Edentulous Lower, Partial Upper   Pulmonary former smoker,    Pulmonary exam normal        Cardiovascular hypertension, + CAD  Normal cardiovascular exam+ dysrhythmias + pacemaker   2015 Study Conclusions  - Left ventricle: The cavity size was normal. Wall thickness was increased in a pattern of mild LVH. Systolic function was normal. The estimated ejection fraction was in the range of 60% to 65%. - Aortic valve: There was mild regurgitation. - Left atrium: The atrium was moderately to severely dilated.    Neuro/Psych CVA, Residual Symptoms negative psych ROS   GI/Hepatic Neg liver ROS,   Endo/Other  diabetes  Renal/GU negative Renal ROS     Musculoskeletal   Abdominal   Peds  Hematology   Anesthesia Other Findings   Reproductive/Obstetrics                         Anesthesia Physical Anesthesia Plan  ASA: III  Anesthesia Plan: General   Post-op Pain Management:    Induction: Intravenous  Airway Management Planned: Oral ETT  Additional Equipment: Arterial line  Intra-op Plan:   Post-operative Plan: Extubation in OR  Informed Consent: I have reviewed the patients History and Physical, chart, labs and discussed the procedure including the risks, benefits and alternatives for the proposed anesthesia with the patient or authorized representative who has indicated his/her understanding and acceptance.   Dental advisory given  Plan Discussed with: CRNA, Anesthesiologist  and Surgeon  Anesthesia Plan Comments: (Discussed procedure, anesthetic, and health history with daughter.)       Anesthesia Quick Evaluation

## 2015-11-05 NOTE — Anesthesia Postprocedure Evaluation (Signed)
Anesthesia Post Note  Patient: Clifford [name]  Procedure(s) Performed: Procedure(s) (LRB): LEFT ATRIAL APPENDAGE OCCLUSION (N/A)  Patient location during evaluation: PACU Anesthesia Type: General Level of consciousness: sedated Pain management: pain level controlled Vital Signs Assessment: post-procedure vital signs reviewed and stable Respiratory status: spontaneous breathing and respiratory function stable Cardiovascular status: stable Anesthetic complications: no    Last Vitals:  Filed Vitals:   11/05/15 1743 11/05/15 1745  BP: 105/57 110/52  Pulse: 76   Temp:    Resp:  12    Last Pain: There were no vitals filed for this visit.               Jakiya Bookbinder DANIEL

## 2015-11-05 NOTE — Anesthesia Procedure Notes (Signed)
Procedure Name: Intubation Date/Time: 11/05/2015 1:48 PM Performed by: Trixie Deis A Pre-anesthesia Checklist: Patient identified, Timeout performed, Emergency Drugs available, Suction available and Patient being monitored Patient Re-evaluated:Patient Re-evaluated prior to inductionOxygen Delivery Method: Circle system utilized Preoxygenation: Pre-oxygenation with 100% oxygen Intubation Type: IV induction Ventilation: Mask ventilation without difficulty Laryngoscope Size: Mac and 4 Grade View: Grade I Tube type: Oral Tube size: 7.5 mm Number of attempts: 1 Airway Equipment and Method: Stylet Placement Confirmation: ETT inserted through vocal cords under direct vision,  breath sounds checked- equal and bilateral and positive ETCO2 Secured at: 23 cm Tube secured with: Tape Dental Injury: Teeth and Oropharynx as per pre-operative assessment

## 2015-11-05 NOTE — Transfer of Care (Signed)
Immediate Anesthesia Transfer of Care Note  Patient: Clifford [name]  Procedure(s) Performed: Procedure(s): LEFT ATRIAL APPENDAGE OCCLUSION (N/A)  Patient Location: PACU  Anesthesia Type:General  Level of Consciousness: awake, alert  and oriented  Airway & Oxygen Therapy: Patient Spontanous Breathing and Patient connected to nasal cannula oxygen  Post-op Assessment: Report given to RN, Post -op Vital signs reviewed and stable and Patient moving all extremities  Post vital signs: Reviewed and stable  Last Vitals:  Filed Vitals:   11/05/15 1610 11/05/15 1615  BP:    Pulse: 63 64  Temp:    Resp:      Last Pain: There were no vitals filed for this visit.       Complications: No apparent anesthesia complications

## 2015-11-05 NOTE — Progress Notes (Signed)
Site area: RFV Site Prior to Removal:  Level 0 Pressure Applied For: 86min Manual:   yes Patient Status During Pull:  stable Post Pull Site:  Level 0 Post Pull Instructions Given:  yes Post Pull Pulses Present: palpable Dressing Applied:  tegaderm Bedrest begins @ [id number] till 2325 Comments:

## 2015-11-05 NOTE — Progress Notes (Signed)
Patient alert and oriented, has some aphasia. At times has difficulty finding the words when answering questions. Does well with patience and is able to communicate needs. Patient verbalized correctly name, birth date, that he was in the hospital and had procedure on his heart. Asked if it was successful, patient answered, "no". Patient incontinent of urine, asked if he has this problem at home, answered, "yes". Resting quietly watching the news.

## 2015-11-06 ENCOUNTER — Inpatient Hospital Stay (HOSPITAL_COMMUNITY): Payer: Commercial Managed Care - HMO

## 2015-11-06 ENCOUNTER — Encounter (HOSPITAL_COMMUNITY): Payer: Self-pay | Admitting: Internal Medicine

## 2015-11-06 DIAGNOSIS — I4891 Unspecified atrial fibrillation: Secondary | ICD-10-CM

## 2015-11-06 DIAGNOSIS — R4182 Altered mental status, unspecified: Secondary | ICD-10-CM

## 2015-11-06 DIAGNOSIS — R4701 Aphasia: Secondary | ICD-10-CM | POA: Insufficient documentation

## 2015-11-06 LAB — BLOOD GAS, ARTERIAL
Acid-base deficit: 0 mmol/L (ref 0.0–2.0)
BICARBONATE: 23.7 meq/L (ref 20.0–24.0)
FIO2: 0.21
O2 SAT: 95.2 %
PH ART: 7.439 (ref 7.350–7.450)
Patient temperature: 97.6
TCO2: 24.9 mmol/L (ref 0–100)
pCO2 arterial: 35.4 mmHg (ref 35.0–45.0)
pO2, Arterial: 71.7 mmHg — ABNORMAL LOW (ref 80.0–100.0)

## 2015-11-06 LAB — GLUCOSE, CAPILLARY
GLUCOSE-CAPILLARY: 106 mg/dL — AB (ref 65–99)
GLUCOSE-CAPILLARY: 93 mg/dL (ref 65–99)
GLUCOSE-CAPILLARY: 98 mg/dL (ref 65–99)
Glucose-Capillary: 67 mg/dL (ref 65–99)
Glucose-Capillary: 97 mg/dL (ref 65–99)
Glucose-Capillary: 130 mg/dL — ABNORMAL HIGH (ref 65–99)

## 2015-11-06 LAB — BASIC METABOLIC PANEL
Anion gap: 5 (ref 5–15)
BUN: 13 mg/dL (ref 6–20)
CHLORIDE: 112 mmol/L — AB (ref 101–111)
CO2: 25 mmol/L (ref 22–32)
CREATININE: 0.74 mg/dL (ref 0.61–1.24)
Calcium: 8.4 mg/dL — ABNORMAL LOW (ref 8.9–10.3)
GFR calc non Af Amer: >60 mL/min (ref >60)
Glucose, Bld: 91 mg/dL (ref 65–99)
POTASSIUM: 3.7 mmol/L (ref 3.5–5.1)
Sodium: 142 mmol/L (ref 135–145)

## 2015-11-06 MED ORDER — IOPAMIDOL (ISOVUE-370) INJECTION 76%
INTRAVENOUS | Status: AC
  Administered 2015-11-06: 100 mL
  Filled 2015-11-06: qty 100

## 2015-11-06 MED ORDER — SODIUM CHLORIDE 0.9 % IV SOLN
1000.0000 mg | Freq: Once | INTRAVENOUS | Status: AC
  Administered 2015-11-06: 02:00:00 1000 mg via INTRAVENOUS
  Filled 2015-11-06: qty 10

## 2015-11-06 MED ORDER — SODIUM CHLORIDE 0.9 % IV SOLN
500.0000 mg | Freq: Two times a day (BID) | INTRAVENOUS | Status: DC
  Administered 2015-11-06 – 2015-11-08 (×6): 500 mg via INTRAVENOUS
  Filled 2015-11-06 (×7): qty 5

## 2015-11-06 MED ORDER — SODIUM CHLORIDE 0.9 % IV SOLN
INTRAVENOUS | Status: DC
  Administered 2015-11-06 – 2015-11-07 (×2): via INTRAVENOUS
  Administered 2015-11-08 – 2015-11-09 (×2): 1000 mL via INTRAVENOUS

## 2015-11-06 MED ORDER — DEXTROSE 50 % IV SOLN
INTRAVENOUS | Status: AC
  Administered 2015-11-06: 25 mL
  Filled 2015-11-06: qty 50

## 2015-11-06 MED ORDER — LORAZEPAM 2 MG/ML IJ SOLN
INTRAMUSCULAR | Status: AC
  Administered 2015-11-06: 1 mg
  Filled 2015-11-06: qty 1

## 2015-11-06 MED ORDER — STROKE: EARLY STAGES OF RECOVERY BOOK
Freq: Once | Status: AC
  Administered 2015-11-06: 18:00:00
  Filled 2015-11-06: qty 1

## 2015-11-06 NOTE — Progress Notes (Signed)
SUBJECTIVE: The patient will open his eyes to voice this morning but will not respond to questions.   CURRENT MEDICATIONS: . aspirin EC  81 mg Oral Daily  . famotidine  20 mg Oral Daily  . folic acid  1 mg Oral Daily  . glipiZIDE  10 mg Oral Daily  . heparin subcutaneous  5,000 Units Subcutaneous Q8H  . hydrochlorothiazide  25 mg Oral Daily  . insulin aspart  0-15 Units Subcutaneous TID WC  . levETIRAcetam  500 mg Intravenous Q12H  . levothyroxine  100 mcg Oral QAC breakfast  . lisinopril  20 mg Oral Daily  . pravastatin  40 mg Oral q1800  . sodium chloride flush  3 mL Intravenous Q12H   . sodium chloride 100 mL/hr at 11/06/15 0145    OBJECTIVE: Physical Exam: Filed Vitals:   11/06/15 0300 11/06/15 0430 11/06/15 0500 11/06/15 0600  BP: 99/50 109/47 115/66 101/64  Pulse:  69 69 77  Temp:      TempSrc:      Resp: 13 11 16 15   Height:      Weight:      SpO2:  99% 100% 100%    Intake/Output Summary (Last 24 hours) at 11/06/15 0723 Last data filed at 11/06/15 0247  Gross per 24 hour  Intake 1448.33 ml  Output    770 ml  Net 678.33 ml    Telemetry reveals rate controlled atrial fibrillation  GEN- The patient is chronically ill appearing, not verbally responsive to me this morninig   Head- normocephalic, atraumatic Eyes-  Sclera clear, conjunctiva pink Ears- hearing intact Oropharynx- clear Neck- supple  Lungs- Clear to ausculation bilaterally, normal work of breathing Heart- Irregular rate and rhythm, no murmurs, rubs or gallops  GI- soft, NT, ND, + BS Extremities- no clubbing, cyanosis, or edema Skin- no rash or lesion Psych- euthymic mood, full affect Neuro- will move bilateral lower extremities and left arm to stimuli.  Doesn't move R arm well.  LABS: Basic Metabolic Panel:  Recent Labs  11/05/15 1241 11/06/15 0524  NA 139 142  K 3.8 3.7  CL 107 112*  CO2 25 25  GLUCOSE 140* 91  BUN 18 13  CREATININE 0.88 0.74  CALCIUM 9.4 8.4*    CBC:  Recent Labs  11/05/15 1241  WBC 5.0  HGB 13.5  HCT 39.3  MCV 83.4  PLT 215    RADIOLOGY: Ct Head Code Stroke Wo Contrast 11/06/2015  CLINICAL DATA:  75 year old male with unresponsiveness and ectasia. Code stroke. EXAM: CT HEAD WITHOUT CONTRAST TECHNIQUE: Contiguous axial images were obtained from the base of the skull through the vertex without intravenous contrast. COMPARISON:  CT of the head dated 02/10/2014 FINDINGS: There is no acute intracranial hemorrhage. There is stable age-related atrophy and chronic microvascular ischemic changes. A focal area of old infarct and encephalomalacia noted in the left posterior frontal convexity. Left parietal focal old infarct and encephalomalacia changes is also seen. These infarcts are new since study dated 02/10/2014. There is no mass effect or midline shift. The visualized paranasal sinuses and mastoid air cells are clear. The calvarium is intact. IMPRESSION: No acute intracranial hemorrhage. Age-related atrophy and chronic microvascular ischemic changes. Left posterior frontal and left parietal convexity old infarcts and encephalomalacia. These results were called by telephone at the time of interpretation on 11/06/2015 at 1:41 am to Dr. Wallie Char , who verbally acknowledged these results. Electronically Signed   By: Anner Crete M.D.   On: 11/06/2015  01:43    ASSESSMENT AND PLAN:  Active Problems:   Atrial fibrillation ([place])  1.  Permanent atrial fibrillation Rate controlled Unsuccessful attempt at watchman implant yesterday CHADS2VASC is at least 6  2.  Sick sinus syndrome Normal device function yesterday  3. Altered mental status Felt to be possibly partial seizure by neurology Continued work up planned per neurology today Continue Keppra as per neurology Pt has pacemaker and is not able to have MRI scan   4.  HTN Stable No change required today  Dr Rayann Heman to see later today  [name], NP 11/06/2015 7:30  AM  I have seen, examined the patient, and reviewed the above assessment and plan.  On exam, responds only to noxious stimuli.  Doesn't move R arm well.  Worrisome for CVA.  Neurology following. Changes to above are made where necessary.    Co Sign: Thompson Grayer, MD 11/06/2015

## 2015-11-06 NOTE — Progress Notes (Signed)
Last known well time 11/05/15 at approx 2300. Pt taken to CT scan based on NIH scale with monitor rapid response RN and CNA Shani, Code Stroke called at [place]. Cardiology Dr. Posey Pronto paged at [place]. pts blood sugar checked and noted to be 65. Hypoglycemia protocol initiated.

## 2015-11-06 NOTE — Procedures (Signed)
History: 75 year old male with new right-sided weakness and aphasia  Sedation: None  Technique: This is a 21 channel routine scalp EEG performed at the bedside with bipolar and monopolar montages arranged in accordance to the international 10/20 system of electrode placement. One channel was dedicated to EKG recording.    Background: The background consists predominantly of irregular theta and delta activities, but there is a posterior dominant rhythm of 7-8 Hz which does attenuate with eye opening. There is Frontally-predominant intermittent rhythmic delta activity(FIRDA). No clear epileptiform activity or seizure was seen  Photic stimulation: Physiologic driving is not performed  EEG Abnormalities: 1) FIRDA 2) generalized irregular slow activity  Clinical Interpretation: This EEG is consistent with a generalized non-specific cerebral dysfunction(encephalopathy). Please note that a normal EEG does not preclude the possibility of epilepsy.   Roland Rack, MD [facility] [phone]  If 7pm- 7am, please page neurology on call as listed in [place].

## 2015-11-06 NOTE — Progress Notes (Signed)
the patient has some expressive aphasia as previously noted. However, it does seem to be worse at this time. And the patient is restless tossing and turning in bed. Rapid response called, will proceed based on NIH and rapid response assessment.

## 2015-11-06 NOTE — Progress Notes (Signed)
Pt with some improvement throughout today - more responsive to voice, still not verbally responsive Dr Rayann Heman and I spoke with daughter at bedside and updated on CT results, transfer to [facility] Dr Rayann Heman spoke with Dr Leonel Ramsay (note pending) who stated would likely be ok to start Eliquis in 24 hours but would need CT scan first to rule out hemorrhage.   [name], NP 11/06/2015 3:36 PM  I spoke with Dr Leonel Ramsay who suspects CVA.  CTA obtained.  At this point, will transfer to neurology floor.  Will initiate eliquis 5mg  BID when ok with neurology.  Will remain in hospital over the weekend.  Prognosis is guarded.  I have spoken with patients daughter at length.  Clifford Rinks [name],MD 11/06/2015

## 2015-11-06 NOTE — Progress Notes (Signed)
Subjective: Appears worse than what was documented overnight.   Exam: Filed Vitals:   11/06/15 1000 11/06/15 1245  BP: 134/59 122/73  Pulse: 74 30  Temp:    Resp: 0 15   Gen: In bed, NAD Resp: non-labored breathing, no acute distress Abd: soft, nt  Neuro: MS: aphasic, does not speak, or follow complicated commands, but does follow simple commands.  [id number] not blink to threat from the right, but does from the left, appears to have a left gaze preference, face relatively symmetric.  Motor: He has a clear right hemiparesis, 1/5 in the right arm, 4/5 right leg.  Sensory:reponds mor on the left than right.   Pertinent Labs: Bmp - unremarkable  Impression: 75 yo M with right sided weakness, aphasia. I suspect that he has had an ischemic stroke in the settin gof being off of anticoagulation for his procedure. Though no large vessel was clearly blocked on CTA, I would favor repeating CT in 24 hours and deciding on resumption timing based on clinical and radiographic course.   Recommendations: 1) asa 2) Lipid panel, A1c 3) repeat CT tomorrow 4) EEG pending  Roland Rack, MD [facility] [phone]  If 7pm- 7am, please page neurology on call as listed in [place].

## 2015-11-06 NOTE — Progress Notes (Signed)
Patient only responds minimally to strong verbal or painful stimulus.  No verbal response other than moans, does not open eyes.  Does not respond to any commands.  Upper extremities flaccid, lower extremities with muscle tone but no movement to command.  Discussed with Benjaman Pott, NP and Dr. Rayann Heman.

## 2015-11-06 NOTE — Consult Note (Signed)
Admission H&P    Chief Complaint: Acute change in mental status.  HPI: Clifford [name] is an 75 y.o. male with a history of stroke with residual aphasia, atrial fibrillation, hypertension, coronary artery disease, hyperlipidemia and diabetes mellitus, who underwent left atrial appendage occlusive procedure earlier today. Patient was noted at about 12:50 AM to be agitated and confused and not following any commands. Speech output was somewhat nonsensical and repetitive. Code stroke was activated for possible acute worsening of expressive aphasia. No focal weakness was noted. CT scan of his head showed no acute intracranial abnormality. Old left frontal and parietal convexity infarctions with encephalomalacia were noted. No seizure activity was reported. Glucose was 97. He was given 1 mg of Ativan IV. He responded well with respect to agitation. There was only a minimal improvement in speech output with short but appropriate responses at times.  Past Medical History  Diagnosis Date  . Persistent atrial fibrillation ([place])   . Hypertensive cardiovascular disease   . Rheumatoid arthritis(714.0)   . CAD (coronary artery disease)   . Goiter   . Sick sinus syndrome ([place])   . GERD (gastroesophageal reflux disease)   . Medically noncompliant   . High cholesterol   . Hypothyroidism   . Type II diabetes mellitus (HCC)     fasting cbg 150-180  . Stroke [facility]) 02/2014    "left sided weakness since" (11/05/2015)  . Chronic lower back pain     Past Surgical History  Procedure Laterality Date  . Pacemaker insertion      MDT sigma  . Back surgery    . Rotator cuff repair Bilateral   . Thyroidectomy N/A 06/12/2014    Procedure: TOTAL THYROIDECTOMY;  Surgeon: Armandina Gemma, MD;  Location: [place];  Service: General;  Laterality: N/A;  . Posterior lumbar fusion      "broke back; crushed vertebrae"  . Lumbar spine hardware removal    . Coronary angioplasty with stent placement  X 2  . Cardiac catheterization   11/05/2015    "unsuccessful WATCHMAN"    Family History  Problem Relation Age of Onset  . Diabetes Mother   . Cancer Brother     prostate  . Alcoholism Brother   . Cancer Paternal Grandfather   . Cancer Brother     lung  . Alcoholism Brother   . Cancer Daughter     skin   Social History:  reports that he quit smoking about 20 years ago. His smoking use included Cigarettes. He started smoking about 70 years ago. He has a 100 pack-year smoking history. He has never used smokeless tobacco. He reports that he does not drink alcohol or use illicit drugs.  Allergies:  Allergies  Allergen Reactions  . Demerol Nausea And Vomiting    Medications Prior to Admission  Medication Sig Dispense Refill  . aspirin EC 81 MG tablet Take 81 mg by mouth daily.    . folic acid (FOLVITE) 1 MG tablet Take 1 mg by mouth daily.    Marland Kitchen glipiZIDE (GLUCOTROL) 10 MG tablet Take 10 mg by mouth daily.     . hydrochlorothiazide (HYDRODIURIL) 25 MG tablet Take 25 mg by mouth daily.    Marland Kitchen levothyroxine (SYNTHROID) 100 MCG tablet Take 1 tablet (100 mcg total) by mouth daily before breakfast. 30 tablet 3  . lisinopril (PRINIVIL,ZESTRIL) 20 MG tablet Take 20 mg by mouth daily.    Marland Kitchen lovastatin (MEVACOR) 40 MG tablet Take 60 mg by mouth at bedtime.     Marland Kitchen  metFORMIN (GLUCOPHAGE) 500 MG tablet Take 1,000 mg by mouth 2 (two) times daily with a meal.     . methotrexate (RHEUMATREX) 2.5 MG tablet Take 10 mg by mouth once a week. On Mondays- Caution:Chemotherapy. Protect from light.    . nitroGLYCERIN (NITROSTAT) 0.4 MG SL tablet Place 1 tablet (0.4 mg total) under the tongue every 5 (five) minutes as needed for chest pain (MAX 3 TABLETS). 25 tablet 3  . ranitidine (ZANTAC) 150 MG tablet Take 150 mg by mouth daily as needed for heartburn.     Nathen May SOLOSTAR 300 UNIT/ML SOPN Inject 40 Units into the skin daily.      ROS: Unavailable due to patient's altered mental status.  Physical Examination: Blood pressure 122/61,  pulse 75, temperature 98 F (36.7 C), temperature source Oral, resp. rate 26, height 5\' 9"  (1.753 m), weight 88.905 kg (196 lb), SpO2 97 %.  HEENT-  Normocephalic, no lesions, without obvious abnormality.  Normal external eye and conjunctiva.  Normal TM's bilaterally.  Normal auditory canals and external ears. Normal external nose, mucus membranes and septum.  Normal pharynx. Neck supple with no masses, nodes, nodules or enlargement. Cardiovascular - regular rate and rhythm, S1, S2 normal and no S3 or S4 Lungs - chest clear, no wheezing, rales, normal symmetric air entry Abdomen - soft, non-tender; bowel sounds normal; no masses,  no organomegaly Extremities - no joint deformities, effusion, or inflammation and no edema  Neurologic Examination: Patient was lethargic and only moderately cooperative. Responses to questions were nonsensical, brief and repetitive. He was in no acute distress. He was not cooperative enough to assess pupillary reactions. Extraocular movements were intact with right left lateral gaze, which were conjugate. Slight right nasolabial flattening was noted. Speech was moderately dysarthric. Motor exam showed intact for long-term movements of all 4 extremities with equal strength. There was no abnormal posturing. Deep tendon reflexes were 2+ and symmetrical throughout. Plantar responses were flexor bilaterally.  Results for orders placed or performed during the hospital encounter of 11/05/15 (from the past 48 hour(s))  Basic metabolic panel     Status: Abnormal   Collection Time: 11/05/15 12:41 PM  Result Value Ref Range   Sodium 139 135 - 145 mmol/L   Potassium 3.8 3.5 - 5.1 mmol/L   Chloride 107 101 - 111 mmol/L   CO2 25 22 - 32 mmol/L   Glucose, Bld 140 (H) 65 - 99 mg/dL   BUN 18 6 - 20 mg/dL   Creatinine, Ser 11/07/15 0.61 - 1.24 mg/dL   Calcium 9.4 8.9 - 9.09 mg/dL   GFR calc non Af Amer >60 >60 mL/min   GFR calc Af Amer >60 >60 mL/min    Comment: (NOTE) The  eGFR has been calculated using the CKD EPI equation. This calculation has not been validated in all clinical situations. eGFR's persistently <60 mL/min signify possible Chronic Kidney Disease.    Anion gap 7 5 - 15  CBC     Status: None   Collection Time: 11/05/15 12:41 PM  Result Value Ref Range   WBC 5.0 4.0 - 10.5 K/uL   RBC 4.71 4.22 - 5.81 MIL/uL   Hemoglobin 13.5 13.0 - 17.0 g/dL   HCT 11/07/15 80.1 - 52.7 %   MCV 83.4 78.0 - 100.0 fL   MCH 28.7 26.0 - 34.0 pg   MCHC 34.4 30.0 - 36.0 g/dL   RDW 07.0 31.7 - 81.8 %   Platelets 215 150 - 400 K/uL  Type and  screen [place]     Status: None   Collection Time: 11/05/15 12:52 PM  Result Value Ref Range   ABO/RH(D) A NEG    Antibody Screen NEG    Sample Expiration 11/08/2015   ABO/Rh     Status: None   Collection Time: 11/05/15 12:52 PM  Result Value Ref Range   ABO/RH(D) A NEG   Glucose, capillary     Status: Abnormal   Collection Time: 11/05/15 12:58 PM  Result Value Ref Range   Glucose-Capillary 138 (H) 65 - 99 mg/dL  POCT Activated clotting time     Status: None   Collection Time: 11/05/15  3:01 PM  Result Value Ref Range   Activated Clotting Time 208 seconds  POCT Activated clotting time     Status: None   Collection Time: 11/05/15  3:30 PM  Result Value Ref Range   Activated Clotting Time 213 seconds  Glucose, capillary     Status: Abnormal   Collection Time: 11/05/15  4:40 PM  Result Value Ref Range   Glucose-Capillary 125 (H) 65 - 99 mg/dL  POCT Activated clotting time     Status: None   Collection Time: 11/05/15  4:42 PM  Result Value Ref Range   Activated Clotting Time 153 seconds  Glucose, capillary     Status: Abnormal   Collection Time: 11/05/15  9:01 PM  Result Value Ref Range   Glucose-Capillary 286 (H) 65 - 99 mg/dL  Glucose, capillary     Status: None   Collection Time: 11/06/15  1:00 AM  Result Value Ref Range   Glucose-Capillary 67 65 - 99 mg/dL  Glucose, capillary     Status:  None   Collection Time: 11/06/15  1:38 AM  Result Value Ref Range   Glucose-Capillary 97 65 - 99 mg/dL   Ct Head Code Stroke Wo Contrast  11/06/2015  CLINICAL DATA:  75 year old male with unresponsiveness and ectasia. Code stroke. EXAM: CT HEAD WITHOUT CONTRAST TECHNIQUE: Contiguous axial images were obtained from the base of the skull through the vertex without intravenous contrast. COMPARISON:  CT of the head dated 02/10/2014 FINDINGS: There is no acute intracranial hemorrhage. There is stable age-related atrophy and chronic microvascular ischemic changes. A focal area of old infarct and encephalomalacia noted in the left posterior frontal convexity. Left parietal focal old infarct and encephalomalacia changes is also seen. These infarcts are new since study dated 02/10/2014. There is no mass effect or midline shift. The visualized paranasal sinuses and mastoid air cells are clear. The calvarium is intact. IMPRESSION: No acute intracranial hemorrhage. Age-related atrophy and chronic microvascular ischemic changes. Left posterior frontal and left parietal convexity old infarcts and encephalomalacia. These results were called by telephone at the time of interpretation on 11/06/2015 at 1:41 am to Dr. Wallie Char , who verbally acknowledged these results. Electronically Signed   By: Anner Crete M.D.   On: 11/06/2015 01:43    Assessment/Plan 75 year old man with atrial fibrillation and status post left atrial appendage occlusive procedure, with an acute change in mental status with confusion and agitation. Etiology is unclear. However, partial seizure is strongly suspected. Recurrent stroke is less likely but cannot be completely ruled out. Code stroke was initially activated. Patient was not considered a candidate for TPA with mental status changes and no clear acute focal deficit.  Recommendations: 1. Will give a loading dose of Keppra 1000 mg, followed by 500 mg every 12 hours 2. EEG, routine  adult study 3. No  contraindication to anticoagulation as planned 4. MRI cannot be obtained because of patient's pacemaker 5. Speech therapy consult  C.R. Nicole Kindred, MD [facility] [phone]  11/06/2015, 2:26 AM

## 2015-11-06 NOTE — Progress Notes (Signed)
Stroke RN notified by Dr. Leonel Ramsay of patient going for CTA and CTP.  Stroke RN met MD and bedside RN in CT and assisted with patient transport back to 6C06.  NIHSS 26, see documentation for details.  Patient nonverbal with left gaze preference, right hemianopia and bilateral weakness more pronounced on the right.  Dr. Leonel Ramsay to the bedside.  No acute stroke treatment at this time.  Patient's daughter at the bedside and updated by MD.  Bedside handoff with Clair Gulling, RN.

## 2015-11-06 NOTE — Progress Notes (Signed)
Called by RN to evaluate patient for worsening aphasia, unable to follow commands and restlessness.  LSN 2300 on 11/05/15.  NIHSS 11.  CBG 67 treated with D50 rechecked 97.  VSS.  As per Dr. Nicole Kindred cancel code stroke.  Patient given 1 mg ativan after CT scan.  [place] stroke cancelled at [place].

## 2015-11-06 NOTE — Progress Notes (Signed)
EEG completed; results pending.    

## 2015-11-07 ENCOUNTER — Inpatient Hospital Stay (HOSPITAL_COMMUNITY): Payer: Commercial Managed Care - HMO

## 2015-11-07 ENCOUNTER — Encounter (HOSPITAL_COMMUNITY): Payer: Self-pay | Admitting: Nurse Practitioner

## 2015-11-07 DIAGNOSIS — I482 Chronic atrial fibrillation: Principal | ICD-10-CM

## 2015-11-07 DIAGNOSIS — I480 Paroxysmal atrial fibrillation: Secondary | ICD-10-CM

## 2015-11-07 DIAGNOSIS — G459 Transient cerebral ischemic attack, unspecified: Secondary | ICD-10-CM | POA: Insufficient documentation

## 2015-11-07 LAB — LIPID PANEL
Cholesterol: 99 mg/dL (ref 0–200)
HDL: 27 mg/dL — AB (ref >40)
LDL CALC: 54 mg/dL (ref 0–99)
Total CHOL/HDL Ratio: 3.7 RATIO
Triglycerides: 90 mg/dL (ref <150)
VLDL: 18 mg/dL (ref 0–40)

## 2015-11-07 LAB — GLUCOSE, CAPILLARY
GLUCOSE-CAPILLARY: 125 mg/dL — AB (ref 65–99)
GLUCOSE-CAPILLARY: 128 mg/dL — AB (ref 65–99)
Glucose-Capillary: 163 mg/dL — ABNORMAL HIGH (ref 65–99)
Glucose-Capillary: 123 mg/dL — ABNORMAL HIGH (ref 65–99)

## 2015-11-07 MED ORDER — LORAZEPAM 2 MG/ML IJ SOLN
0.2500 mg | Freq: Four times a day (QID) | INTRAMUSCULAR | Status: DC | PRN
  Administered 2015-11-07 – 2015-11-08 (×2): 0.25 mg via INTRAVENOUS
  Filled 2015-11-07 (×2): qty 1

## 2015-11-07 NOTE — Progress Notes (Signed)
Patient ID: FON RIJO, male   DOB: 1940-12-12, 75 y.o.   MRN: [id number]    Patient Name: Clifford [name] Date of Encounter: 11/07/2015     Active Problems:   Atrial fibrillation [facility])   Aphasia    SUBJECTIVE  "where is my doctor". Denies HA or chest pain.   CURRENT MEDS . aspirin EC  81 mg Oral Daily  . famotidine  20 mg Oral Daily  . folic acid  1 mg Oral Daily  . glipiZIDE  10 mg Oral Daily  . heparin subcutaneous  5,000 Units Subcutaneous Q8H  . hydrochlorothiazide  25 mg Oral Daily  . insulin aspart  0-15 Units Subcutaneous TID WC  . levETIRAcetam  500 mg Intravenous Q12H  . levothyroxine  100 mcg Oral QAC breakfast  . lisinopril  20 mg Oral Daily  . pravastatin  40 mg Oral q1800  . sodium chloride flush  3 mL Intravenous Q12H    OBJECTIVE  Filed Vitals:   11/06/15 1800 11/06/15 2200 11/07/15 0117 11/07/15 0537  BP:  105/64 111/58 129/65  Pulse:  71 67 65  Temp: 98.9 F (37.2 C) 99.2 F (37.3 C) 98.9 F (37.2 C) 98.6 F (37 C)  TempSrc:  Oral Oral Oral  Resp:  18 18 18   Height:      Weight:      SpO2:  100% 100% 99%    Intake/Output Summary (Last 24 hours) at 11/07/15 0906 Last data filed at 11/06/15 2330  Gross per 24 hour  Intake    240 ml  Output    700 ml  Net   -460 ml   Filed Weights   11/05/15 1239 11/06/15 0246  Weight: 196 lb (88.905 kg) 168 lb 14 oz (76.6 kg)    PHYSICAL EXAM  General: Pleasant, NAD. Neuro: Alert but confused. Moves all extremities spontaneously. Right side appears a little weak.  Psych: Normal affect. HEENT:  Normal  Neck: Supple without bruits or JVD. Lungs:  Resp regular and unlabored, CTA. Heart: RRR no s3, s4, or murmurs. Abdomen: Soft, non-tender, non-distended, BS + x 4.  Extremities: No clubbing, cyanosis or edema. DP/PT/Radials 2+ and equal bilaterally.  Accessory Clinical Findings  CBC  Recent Labs  11/05/15 1241  WBC 5.0  HGB 13.5  HCT 39.3  MCV 83.4  PLT 123456   Basic Metabolic  Panel  Recent Labs  11/05/15 1241 11/06/15 0524  NA 139 142  K 3.8 3.7  CL 107 112*  CO2 25 25  GLUCOSE 140* 91  BUN 18 13  CREATININE 0.88 0.74  CALCIUM 9.4 8.4*   Liver Function Tests No results for input(s): AST, ALT, ALKPHOS, BILITOT, PROT, ALBUMIN in the last 72 hours. No results for input(s): LIPASE, AMYLASE in the last 72 hours. Cardiac Enzymes No results for input(s): CKTOTAL, CKMB, CKMBINDEX, TROPONINI in the last 72 hours. BNP Invalid input(s): POCBNP D-Dimer No results for input(s): DDIMER in the last 72 hours. Hemoglobin A1C No results for input(s): HGBA1C in the last 72 hours. Fasting Lipid Panel  Recent Labs  11/07/15 0527  CHOL 99  HDL 27*  LDLCALC 54  TRIG 90  CHOLHDL 3.7   Thyroid Function Tests No results for input(s): TSH, T4TOTAL, T3FREE, THYROIDAB in the last 72 hours.  Invalid input(s): FREET3  TELE  Atrial fib with a controlled VR  Radiology/Studies  Ct Angio Head W Or Wo Contrast  11/06/2015  CLINICAL DATA:  Code stroke. Right-sided weakness. Cardiac catheterization 11/05/2015 EXAM:  CT ANGIOGRAPHY HEAD AND NECK TECHNIQUE: Multidetector CT imaging of the head and neck was performed using the standard protocol during bolus administration of intravenous contrast. Multiplanar CT image reconstructions and MIPs were obtained to evaluate the vascular anatomy. Carotid stenosis measurements (when applicable) are obtained utilizing NASCET criteria, using the distal internal carotid diameter as the denominator. CONTRAST:  40 mL Isovue 370 for CT perfusion. 60 mL Isovue 370 for CT angio COMPARISON:  CT head 11/06/2015 FINDINGS: CTA NECK Aortic arch: Mild atherosclerotic calcification aortic arch and proximal great vessels. Proximal great vessels widely patent. Pacemaker leads noted. Moderate to advanced apical emphysema. Right carotid system: Right common carotid artery widely patent. Mild atherosclerotic calcification right carotid bifurcation without  significant carotid stenosis. Left carotid system: Left common carotid artery widely patent. Mild atherosclerotic calcification at the carotid bifurcation without significant carotid stenosis. Vertebral arteries:Left vertebral dominant. Left vertebral dominant. Left vertebral artery widely patent to the basilar without stenosis. Right vertebral artery mild stenosis at the level of C1. Occlusion of the distal right vertebral artery past PICA due to calcific plaque. Skeleton: Cervical spondylosis.  No fracture or bony lesion. Other neck: Thyroidectomy clips.  No mass or adenopathy in the neck. CTA HEAD Anterior circulation: Extensive atherosclerotic calcification in the cavernous carotid bilaterally causing mild to moderate stenosis bilaterally. 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 Anterior and middle cerebral arteries patent bilaterally without significant stenosis. Posterior circulation: Left vertebral artery widely patent to the basilar. Small right vertebral artery is occluded distally due to atherosclerotic plaque. PICA patent bilaterally. Basilar patent. Superior cerebellar and posterior cerebral arteries patent bilaterally. Severe focal stenosis in the right posterior cerebral artery proximally. Left posterior cerebral artery widely patent. Venous sinuses: Patent Anatomic variants: Negative for cerebral aneurysm. Delayed phase: Not performed IMPRESSION: CT perfusion degraded by patient motion causing artifact. No evidence of acute ischemia in the left cerebral hemisphere. Probable artifact right cerebral hemisphere on CT perfusion Mild atherosclerotic disease at the carotid bifurcation bilaterally without significant carotid stenosis in the neck Diffuse atherosclerotic disease throughout the cavernous carotid bilaterally. 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 Right vertebral artery  occluded distal to PICA The findings were discussed with Dr. Leonel Ramsay on 11/06/2015 at 1200 hours. Electronically Signed   By: Franchot Gallo M.D.   On: 11/06/2015 12:11   Ct Head Wo Contrast  11/07/2015  CLINICAL DATA:  RIGHT-sided weakness. History of hypertension, diabetes and stroke. EXAM: CT HEAD WITHOUT CONTRAST TECHNIQUE: Contiguous axial images were obtained from the base of the skull through the vertex without intravenous contrast. COMPARISON:  CT HEAD June 23rd 2017 FINDINGS: INTRACRANIAL CONTENTS: No intraparenchymal hemorrhage, mass effect, midline shift or acute large vascular territory infarct. Small area low LEFT posterior frontal lobe encephalomalacia. Old bilateral basal ganglia lacunar infarcts. Wedge-like area of moderate LEFT parietal lobe encephalomalacia. Ventricles and sulci are normal for patient's age. No abnormal extra-axial fluid collections. Basal cisterns are patent. Severe calcific atherosclerosis of the carotid bulbs. ORBITS: The included ocular globes and orbital contents are normal. SINUSES: Trace paranasal sinus mucosal thickening without air-fluid levels. The mastoid air cells are well aerated. SKULL/SOFT TISSUES: No skull fracture. No significant soft tissue swelling. IMPRESSION: No acute intracranial process. Chronic changes including LEFT frontoparietal encephalomalacia compatible with old MCA territory infarct and old bilateral basal ganglia lacunar infarcts. Electronically Signed   By: Elon Alas M.D.   On: 11/07/2015 04:18   Ct Angio Neck W Or  Wo Contrast  11/06/2015  CLINICAL DATA:  Code stroke. Right-sided weakness. Cardiac catheterization 11/05/2015 EXAM: CT ANGIOGRAPHY HEAD AND NECK TECHNIQUE: Multidetector CT imaging of the head and neck was performed using the standard protocol during bolus administration of intravenous contrast. Multiplanar CT image reconstructions and MIPs were obtained to evaluate the vascular anatomy. Carotid stenosis measurements  (when applicable) are obtained utilizing NASCET criteria, using the distal internal carotid diameter as the denominator. CONTRAST:  40 mL Isovue 370 for CT perfusion. 60 mL Isovue 370 for CT angio COMPARISON:  CT head 11/06/2015 FINDINGS: CTA NECK Aortic arch: Mild atherosclerotic calcification aortic arch and proximal great vessels. Proximal great vessels widely patent. Pacemaker leads noted. Moderate to advanced apical emphysema. Right carotid system: Right common carotid artery widely patent. Mild atherosclerotic calcification right carotid bifurcation without significant carotid stenosis. Left carotid system: Left common carotid artery widely patent. Mild atherosclerotic calcification at the carotid bifurcation without significant carotid stenosis. Vertebral arteries:Left vertebral dominant. Left vertebral dominant. Left vertebral artery widely patent to the basilar without stenosis. Right vertebral artery mild stenosis at the level of C1. Occlusion of the distal right vertebral artery past PICA due to calcific plaque. Skeleton: Cervical spondylosis.  No fracture or bony lesion. Other neck: Thyroidectomy clips.  No mass or adenopathy in the neck. CTA HEAD Anterior circulation: Extensive atherosclerotic calcification in the cavernous carotid bilaterally causing mild to moderate stenosis bilaterally. 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 Anterior and middle cerebral arteries patent bilaterally without significant stenosis. Posterior circulation: Left vertebral artery widely patent to the basilar. Small right vertebral artery is occluded distally due to atherosclerotic plaque. PICA patent bilaterally. Basilar patent. Superior cerebellar and posterior cerebral arteries patent bilaterally. Severe focal stenosis in the right posterior cerebral artery proximally. Left posterior cerebral artery widely patent. Venous sinuses: Patent Anatomic variants: Negative for cerebral aneurysm.  Delayed phase: Not performed IMPRESSION: CT perfusion degraded by patient motion causing artifact. No evidence of acute ischemia in the left cerebral hemisphere. Probable artifact right cerebral hemisphere on CT perfusion Mild atherosclerotic disease at the carotid bifurcation bilaterally without significant carotid stenosis in the neck Diffuse atherosclerotic disease throughout the cavernous carotid bilaterally. 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 Right vertebral artery occluded distal to PICA The findings were discussed with Dr. Leonel Ramsay on 11/06/2015 at 1200 hours. Electronically Signed   By: Franchot Gallo M.D.   On: 11/06/2015 12:11   Ct Cerebral Perfusion W Contrast  11/06/2015  CLINICAL DATA:  Right-sided weakness.  Possible stroke. EXAM: CT CEREBRAL PERFUSION WITH CONTRAST TECHNIQUE: The patient was moving during the study degrading image quality. Perfusion maps are degraded by motion. CONTRAST:  40 mL Isovue 370 IV for perfusion. 60 mL Isovue 370 for CT angio. COMPARISON:  CT head 11/06/2015 FINDINGS: No significant perfusion defects in the left cerebral hemisphere. Scattered areas of decreased cerebral blood flow and increased mean transit time in the right cerebral hemisphere which are probably artifactual, given the patient's right-sided weakness. IMPRESSION: Limited study due to motion. No definite left hemispheric acute ischemia. Electronically Signed   By: Franchot Gallo M.D.   On: 11/06/2015 11:58   Ct Head Code Stroke Wo Contrast  11/06/2015  CLINICAL DATA:  75 year old male with unresponsiveness and ectasia. Code stroke. EXAM: CT HEAD WITHOUT CONTRAST TECHNIQUE: Contiguous axial images were obtained from the base of the skull through the vertex without intravenous contrast. COMPARISON:  CT of the head dated 02/10/2014  FINDINGS: There is no acute intracranial hemorrhage. There is stable  age-related atrophy and chronic microvascular ischemic changes. A focal area of old infarct and encephalomalacia noted in the left posterior frontal convexity. Left parietal focal old infarct and encephalomalacia changes is also seen. These infarcts are new since study dated 02/10/2014. There is no mass effect or midline shift. The visualized paranasal sinuses and mastoid air cells are clear. The calvarium is intact. IMPRESSION: No acute intracranial hemorrhage. Age-related atrophy and chronic microvascular ischemic changes. Left posterior frontal and left parietal convexity old infarcts and encephalomalacia. These results were called by telephone at the time of interpretation on 11/06/2015 at 1:41 am to Dr. Wallie Char , who verbally acknowledged these results. Electronically Signed   By: Anner Crete M.D.   On: 11/06/2015 01:43    ASSESSMENT AND PLAN  1. S/p Watchman, with possible stroke - he is now awake but confused and appears to be moving right side well. Might be a little weak. CT scan reviewed.  2. Atrial fib - his rate is controlled with intermittent pacing 3. Agitation - his nurse is asking for something for sedation but I would prefer to defer to neuro service for this.   Tenae Graziosi,M.D.  11/07/2015 9:06 AM

## 2015-11-07 NOTE — Progress Notes (Signed)
STROKE TEAM PROGRESS NOTE   HISTORY OF PRESENT ILLNESS (per record) Clifford [name] is an 75 y.o. male with a history of stroke with residual aphasia, atrial fibrillation, hypertension, coronary artery disease, hyperlipidemia and diabetes mellitus, who underwent left atrial appendage occlusive procedure earlier today. Patient was noted at about 12:50 AM to be agitated and confused and not following any commands. Speech output was somewhat nonsensical and repetitive. Code stroke was activated for possible acute worsening of expressive aphasia. No focal weakness was noted. CT scan of his head showed no acute intracranial abnormality. Old left frontal and parietal convexity infarctions with encephalomalacia were noted. No seizure activity was reported. Glucose was 97. He was given 1 mg of Ativan IV. He responded well with respect to agitation. There was only a minimal improvement in speech output with short but appropriate responses at times.   SUBJECTIVE (INTERVAL HISTORY) There is no family at the bedside. Patient is quite agitated and is sitting at the nurses station due to this. The nurses would like something IV for acute agitation, I discussed that they can give him IV Ativan very low dose only as needed and if patient cannot be redirected or calmed down as this can worsen confusion and somnolence.   OBJECTIVE Temp:  [98.6 F (37 C)-99.2 F (37.3 C)] 98.6 F (37 C) (06/24 0537) Pulse Rate:  [30-74] 65 (06/24 0537) Cardiac Rhythm:  [-] Atrial fibrillation;Ventricular paced (06/23 1937) Resp:  [0-18] 18 (06/24 0537) BP: (105-134)/(58-73) 129/65 mmHg (06/24 0537) SpO2:  [96 %-100 %] 99 % (06/24 0537)  CBC:  Recent Labs Lab 11/05/15 1241  WBC 5.0  HGB 13.5  HCT 39.3  MCV 83.4  PLT 123456    Basic Metabolic Panel:  Recent Labs Lab 11/05/15 1241 11/06/15 0524  NA 139 142  K 3.8 3.7  CL 107 112*  CO2 25 25  GLUCOSE 140* 91  BUN 18 13  CREATININE 0.88 0.74  CALCIUM 9.4 8.4*     Lipid Panel:    Component Value Date/Time   CHOL 99 11/07/2015 0527   TRIG 90 11/07/2015 0527   HDL 27* 11/07/2015 0527   CHOLHDL 3.7 11/07/2015 0527   VLDL 18 11/07/2015 0527   LDLCALC 54 11/07/2015 0527   HgbA1c:  Lab Results  Component Value Date   HGBA1C 8.0* 06/10/2014   Urine Drug Screen: No results found for: LABOPIA, COCAINSCRNUR, LABBENZ, AMPHETMU, THCU, LABBARB    IMAGING  Ct Angio Head and Neck W Or Wo Contrast 11/06/2015   CT perfusion degraded by patient motion causing artifact. No evidence of acute ischemia in the left cerebral hemisphere. Probable artifact right cerebral hemisphere on CT perfusion Mild atherosclerotic disease at the carotid bifurcation bilaterally without significant carotid stenosis in the neck Diffuse atherosclerotic disease throughout the cavernous carotid bilaterally. 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  Chronic changes including LEFT frontoparietal encephalomalacia compatible with old MCA territory infarct and old bilateral basal ganglia lacunar infarcts.    Ct Cerebral Perfusion W Contrast 11/06/2015   Limited study due to motion. No definite left hemispheric acute ischemia.    Ct Head Code Stroke Wo Contrast 11/06/2015   No acute intracranial hemorrhage. Age-related atrophy and chronic microvascular ischemic changes. Left posterior frontal and left parietal convexity old infarcts and encephalomalacia.     No  MRI due to pacemaker   Head CT Wo Contrast - Pending (Sunday AM)   EEG 11/05/17 EEG Abnormalities:  1) FIRDA 2) generalized irregular slow activity  Clinical Interpretation: This EEG is consistent with a generalized non-specific cerebral dysfunction(encephalopathy). Please note that a normal EEG does not preclude  the possibility of epilepsy.    Transthoracic Cardiac Echocardiogram 11/05/2015 Study Conclusions - Left ventricle: Systolic function was normal. The estimated  ejection fraction was in the range of 50% to 55%. Wall motion was  normal; there were no regional wall motion abnormalities. - Aortic valve: There was mild regurgitation. - Left atrium: No evidence of thrombus in the atrial cavity or appendage. - Right atrium: No evidence of thrombus in the atrial cavity or  appendage. Impressions: - This was a periprocedural TEE during a Watchman device  implantation.   PHYSICAL EXAM Physical exam: Exam: Gen: NAD Eyes: anicteric sclerae, moist conjunctivae                    CV: no MRG, no carotid bruits, no peripheral edema Mental Status: Alert, follows some simple commands, poor historian, agitated and confused  Neuro: Detailed Neurologic Exam  Speech:  No dysarthria. Unclear if patient is aphasic or if there is confusion interfering with his understanding. He is able to talk fluently and is oriented to his name however when asked what month it is he says "4" and asked what year it is he says "6". He follows some simple commands.  Cranial Nerves:    The pupils are equal, round, and reactive to light.. Attempted, Fundi not visualized due to patient noncooperation.  EOMI. Visual fields full to threat. Face symmetric, Tongue midline. Hearing intact to voice. Shoulder shrug intact. Uvula midline.  Motor Observation:    no involuntary movements noted. Tone appears normal.     Strength:    Difficult to assess strength due to noncooperation and confusion/agitation however there is no drift in his arms or his legs and he is able to hold them up antigravity and appears symmetric in strength.     Sensation:  Patient withdraws in all extremities.  DTR: Patient's reflexes appeared asymmetric and brisker on the left however very difficult to say for certain given patient's agitation and  noncooperation.  Plantars downgoing.          ASSESSMENT/PLAN Clifford [name] is a 75 y.o. male with history of persistent atrial fibrillation, hypertension, rheumatoid arthritis, coronary artery disease, diabetes mellitus, previous stroke, lipidemia, sick sinus syndrome with permanent pacemaker, and medically noncompliant presenting with agitation, confusion, inability to follow commands and speech difficulties following a cardiac procedure.  He did not receive IV t-PA due to recent invasive procedure.  Possible Stroke vs TIA: Dominant possibly embolic related to procedure.  Resultant  no focal deficits, confusion and agitation.  MRI  pacemaker  MRA  Pacemaker  EEG - (see above) consistent with a generalized non-specific cerebral dysfunction(encephalopathy.  Carotid Doppler - refer to CTA of the neck  2D Echo - EF 50-55%. No cardiac source of emboli identified.  LDL - 54  HgbA1c pending  VTE prophylaxis - subcutaneous heparin  Diet heart healthy/carb modified Room service appropriate?: Yes; Fluid consistency:: Thin  Patient has been on Eliquis with non-compliance, aspirin 81 mg daily prior to admission, now on aspirin 81 mg daily. Given his atrial fibrillation, we recommend starting Eliquis for stroke prevention.  Given his history of noncompliance and prior GI bleeding, we will defer decision  to  cardiology.  Patient counseled to be compliant with his antithrombotic medications  Ongoing aggressive stroke risk factor management  Therapy recommendations: pending   Disposition:  Pending  Hypertension  Stable - blood pressure runs mildly low  Permissive hypertension (OK if < 220/120) but gradually normalize in 5-7 days  Long-term BP goal normotensive  Hyperlipidemia  Home meds:  Mevacor 40 mg daily resumed in hospital  LDL 54, goal < 70  Now on Pravachol 40 mg daily  Continue statin at discharge  Diabetes  HgbA1c pending, goal < 7.0   Other Stroke  Risk Factors  Advanced age  Cigarette smoker - quit smoking 20 years ago  Hx stroke/TIA  Coronary artery disease   Other Active Problems  Possible seizure activity - EEG as above - currently on Keppra  Unable to have MRI secondary to permanent pacemaker - repeat head CT in a.m.  Agitation: The patient has been agitated and is currently at the nurse's station. The nurses would like something IV for acute agitation, I discussed that they can give him IV Ativan very low dose only as needed and if patient cannot be redirected or calmed down as this can worsen confusion and somnolence.    Hospital day # 2   Personally examined patient and images, and have participated in and made any corrections needed to history, physical, neuro exam,assessment and plan as stated above.  I have personally obtained the history, evaluated lab date, reviewed imaging studies and agree with radiology interpretations.    Sarina Ill, MD Stroke Neurology [phone] [facility]      To contact Stroke Continuity provider, please refer to [email or web address] After hours, contact General Neurology

## 2015-11-08 ENCOUNTER — Inpatient Hospital Stay (HOSPITAL_COMMUNITY): Payer: Commercial Managed Care - HMO

## 2015-11-08 DIAGNOSIS — G458 Other transient cerebral ischemic attacks and related syndromes: Secondary | ICD-10-CM

## 2015-11-08 DIAGNOSIS — I639 Cerebral infarction, unspecified: Secondary | ICD-10-CM | POA: Insufficient documentation

## 2015-11-08 DIAGNOSIS — I6349 Cerebral infarction due to embolism of other cerebral artery: Secondary | ICD-10-CM

## 2015-11-08 LAB — GLUCOSE, CAPILLARY
GLUCOSE-CAPILLARY: 186 mg/dL — AB (ref 65–99)
Glucose-Capillary: 153 mg/dL — ABNORMAL HIGH (ref 65–99)
Glucose-Capillary: 129 mg/dL — ABNORMAL HIGH (ref 65–99)
Glucose-Capillary: 121 mg/dL — ABNORMAL HIGH (ref 65–99)

## 2015-11-08 MED ORDER — APIXABAN 5 MG PO TABS
5.0000 mg | ORAL_TABLET | Freq: Two times a day (BID) | ORAL | Status: DC
  Administered 2015-11-08 – 2015-11-10 (×4): 5 mg via ORAL
  Filled 2015-11-08 (×4): qty 1

## 2015-11-08 NOTE — Progress Notes (Signed)
Patient ID: Clifford [name], male   DOB: 12/20/1940, 75 y.o.   MRN: [id number]    Patient Name: Clifford [name] Date of Encounter: 11/08/2015     Active Problems:   Atrial fibrillation (HCC)   Aphasia   Transient cerebral ischemia    SUBJECTIVE  "Where is Dr. Rayann Heman". Denies chest pain or sob.  CURRENT MEDS . aspirin EC  81 mg Oral Daily  . famotidine  20 mg Oral Daily  . folic acid  1 mg Oral Daily  . glipiZIDE  10 mg Oral Daily  . heparin subcutaneous  5,000 Units Subcutaneous Q8H  . hydrochlorothiazide  25 mg Oral Daily  . insulin aspart  0-15 Units Subcutaneous TID WC  . levETIRAcetam  500 mg Intravenous Q12H  . levothyroxine  100 mcg Oral QAC breakfast  . lisinopril  20 mg Oral Daily  . pravastatin  40 mg Oral q1800  . sodium chloride flush  3 mL Intravenous Q12H    OBJECTIVE  Filed Vitals:   11/07/15 0939 11/07/15 1451 11/07/15 2113 11/08/15 0620  BP: 118/68 101/56 123/77 143/85  Pulse: 103 74 77 74  Temp: 97.8 F (36.6 C) 98.3 F (36.8 C) 98.8 F (37.1 C) 99.8 F (37.7 C)  TempSrc: Oral Oral Oral Oral  Resp: 18 18 19 16   Height:      Weight:      SpO2: 99% 97% 100% 100%    Intake/Output Summary (Last 24 hours) at 11/08/15 0820 Last data filed at 11/07/15 1800  Gross per 24 hour  Intake   1840 ml  Output      0 ml  Net   1840 ml   Filed Weights   11/05/15 1239 11/06/15 0246  Weight: 196 lb (88.905 kg) 168 lb 14 oz (76.6 kg)    PHYSICAL EXAM  General: Pleasant, NAD. Neuro: Alert and oriented X 3. Moves all extremities spontaneously. Psych: Normal affect. HEENT:  Normal  Neck: Supple without bruits or JVD. Lungs:  Resp regular and unlabored, CTA. Heart: IRIRR no s3, s4, or murmurs. Abdomen: Soft, non-tender, non-distended, BS + x 4.  Extremities: No clubbing, cyanosis or edema. DP/PT/Radials 2+ and equal bilaterally.  Accessory Clinical Findings  CBC  Recent Labs  11/05/15 1241  WBC 5.0  HGB 13.5  HCT 39.3  MCV 83.4  PLT 123456    Basic Metabolic Panel  Recent Labs  11/05/15 1241 11/06/15 0524  NA 139 142  K 3.8 3.7  CL 107 112*  CO2 25 25  GLUCOSE 140* 91  BUN 18 13  CREATININE 0.88 0.74  CALCIUM 9.4 8.4*   Liver Function Tests No results for input(s): AST, ALT, ALKPHOS, BILITOT, PROT, ALBUMIN in the last 72 hours. No results for input(s): LIPASE, AMYLASE in the last 72 hours. Cardiac Enzymes No results for input(s): CKTOTAL, CKMB, CKMBINDEX, TROPONINI in the last 72 hours. BNP Invalid input(s): POCBNP D-Dimer No results for input(s): DDIMER in the last 72 hours. Hemoglobin A1C No results for input(s): HGBA1C in the last 72 hours. Fasting Lipid Panel  Recent Labs  11/07/15 0527  CHOL 99  HDL 27*  LDLCALC 54  TRIG 90  CHOLHDL 3.7   Thyroid Function Tests No results for input(s): TSH, T4TOTAL, T3FREE, THYROIDAB in the last 72 hours.  Invalid input(s): FREET3  TELE  Atrial fib with a CVR  Radiology/Studies  Ct Angio Head W Or Wo Contrast  11/06/2015  CLINICAL DATA:  Code stroke. Right-sided weakness. Cardiac catheterization 11/05/2015 EXAM: CT  ANGIOGRAPHY HEAD AND NECK TECHNIQUE: Multidetector CT imaging of the head and neck was performed using the standard protocol during bolus administration of intravenous contrast. Multiplanar CT image reconstructions and MIPs were obtained to evaluate the vascular anatomy. Carotid stenosis measurements (when applicable) are obtained utilizing NASCET criteria, using the distal internal carotid diameter as the denominator. CONTRAST:  40 mL Isovue 370 for CT perfusion. 60 mL Isovue 370 for CT angio COMPARISON:  CT head 11/06/2015 FINDINGS: CTA NECK Aortic arch: Mild atherosclerotic calcification aortic arch and proximal great vessels. Proximal great vessels widely patent. Pacemaker leads noted. Moderate to advanced apical emphysema. Right carotid system: Right common carotid artery widely patent. Mild atherosclerotic calcification right carotid bifurcation  without significant carotid stenosis. Left carotid system: Left common carotid artery widely patent. Mild atherosclerotic calcification at the carotid bifurcation without significant carotid stenosis. Vertebral arteries:Left vertebral dominant. Left vertebral dominant. Left vertebral artery widely patent to the basilar without stenosis. Right vertebral artery mild stenosis at the level of C1. Occlusion of the distal right vertebral artery past PICA due to calcific plaque. Skeleton: Cervical spondylosis.  No fracture or bony lesion. Other neck: Thyroidectomy clips.  No mass or adenopathy in the neck. CTA HEAD Anterior circulation: Extensive atherosclerotic calcification in the cavernous carotid bilaterally causing mild to moderate stenosis bilaterally. 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 Anterior and middle cerebral arteries patent bilaterally without significant stenosis. Posterior circulation: Left vertebral artery widely patent to the basilar. Small right vertebral artery is occluded distally due to atherosclerotic plaque. PICA patent bilaterally. Basilar patent. Superior cerebellar and posterior cerebral arteries patent bilaterally. Severe focal stenosis in the right posterior cerebral artery proximally. Left posterior cerebral artery widely patent. Venous sinuses: Patent Anatomic variants: Negative for cerebral aneurysm. Delayed phase: Not performed IMPRESSION: CT perfusion degraded by patient motion causing artifact. No evidence of acute ischemia in the left cerebral hemisphere. Probable artifact right cerebral hemisphere on CT perfusion Mild atherosclerotic disease at the carotid bifurcation bilaterally without significant carotid stenosis in the neck Diffuse atherosclerotic disease throughout the cavernous carotid bilaterally. 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 Right vertebral  artery occluded distal to PICA The findings were discussed with Dr. Leonel Ramsay on 11/06/2015 at 1200 hours. Electronically Signed   By: Franchot Gallo M.D.   On: 11/06/2015 12:11   Ct Head Wo Contrast  11/08/2015  CLINICAL DATA:  Confusion and agitation. Inability to follow commands with speech difficulty after cardiac procedure. Stroke. History of hypertension, diabetes. EXAM: CT HEAD WITHOUT CONTRAST TECHNIQUE: Contiguous axial images were obtained from the base of the skull through the vertex without intravenous contrast. COMPARISON:  CT HEAD November 07, 2015 FINDINGS: INTRACRANIAL CONTENTS: The ventricles and sulci are normal for age. No intraparenchymal hemorrhage, mass effect nor midline shift. Patchy supratentorial white matter hypodensities are within normal range for patient's age and though non-specific likely represent chronic small vessel ischemic disease. Re- demonstration of LEFT frontal and LEFT parietal lobe encephalomalacia. Old bilateral basal ganglia lacunar infarcts. No acute large vascular territory infarcts. No abnormal extra-axial fluid collections. Basal cisterns are patent. Moderate calcific atherosclerosis of the carotid siphons. ORBITS: The included ocular globes and orbital contents are non-suspicious. SINUSES: Trace paranasal sinus mucosal thickening without air-fluid levels. The mastoid air cells are well aerated. SKULL/SOFT TISSUES: No skull fracture. No significant soft tissue swelling. Patient is edentulous. LEFT suboccipital sebaceous cyst. IMPRESSION: No acute intracranial process. Stable appearance of the head including old  LEFT MCA territory infarcts and old bilateral basal ganglia lacunar infarcts. Electronically Signed   By: Elon Alas M.D.   On: 11/08/2015 03:44   Ct Head Wo Contrast  11/07/2015  CLINICAL DATA:  RIGHT-sided weakness. History of hypertension, diabetes and stroke. EXAM: CT HEAD WITHOUT CONTRAST TECHNIQUE: Contiguous axial images were obtained from the  base of the skull through the vertex without intravenous contrast. COMPARISON:  CT HEAD June 23rd 2017 FINDINGS: INTRACRANIAL CONTENTS: No intraparenchymal hemorrhage, mass effect, midline shift or acute large vascular territory infarct. Small area low LEFT posterior frontal lobe encephalomalacia. Old bilateral basal ganglia lacunar infarcts. Wedge-like area of moderate LEFT parietal lobe encephalomalacia. Ventricles and sulci are normal for patient's age. No abnormal extra-axial fluid collections. Basal cisterns are patent. Severe calcific atherosclerosis of the carotid bulbs. ORBITS: The included ocular globes and orbital contents are normal. SINUSES: Trace paranasal sinus mucosal thickening without air-fluid levels. The mastoid air cells are well aerated. SKULL/SOFT TISSUES: No skull fracture. No significant soft tissue swelling. IMPRESSION: No acute intracranial process. Chronic changes including LEFT frontoparietal encephalomalacia compatible with old MCA territory infarct and old bilateral basal ganglia lacunar infarcts. Electronically Signed   By: Elon Alas M.D.   On: 11/07/2015 04:18   Ct Angio Neck W Or Wo Contrast  11/06/2015  CLINICAL DATA:  Code stroke. Right-sided weakness. Cardiac catheterization 11/05/2015 EXAM: CT ANGIOGRAPHY HEAD AND NECK TECHNIQUE: Multidetector CT imaging of the head and neck was performed using the standard protocol during bolus administration of intravenous contrast. Multiplanar CT image reconstructions and MIPs were obtained to evaluate the vascular anatomy. Carotid stenosis measurements (when applicable) are obtained utilizing NASCET criteria, using the distal internal carotid diameter as the denominator. CONTRAST:  40 mL Isovue 370 for CT perfusion. 60 mL Isovue 370 for CT angio COMPARISON:  CT head 11/06/2015 FINDINGS: CTA NECK Aortic arch: Mild atherosclerotic calcification aortic arch and proximal great vessels. Proximal great vessels widely patent. Pacemaker  leads noted. Moderate to advanced apical emphysema. Right carotid system: Right common carotid artery widely patent. Mild atherosclerotic calcification right carotid bifurcation without significant carotid stenosis. Left carotid system: Left common carotid artery widely patent. Mild atherosclerotic calcification at the carotid bifurcation without significant carotid stenosis. Vertebral arteries:Left vertebral dominant. Left vertebral dominant. Left vertebral artery widely patent to the basilar without stenosis. Right vertebral artery mild stenosis at the level of C1. Occlusion of the distal right vertebral artery past PICA due to calcific plaque. Skeleton: Cervical spondylosis.  No fracture or bony lesion. Other neck: Thyroidectomy clips.  No mass or adenopathy in the neck. CTA HEAD Anterior circulation: Extensive atherosclerotic calcification in the cavernous carotid bilaterally causing mild to moderate stenosis bilaterally. 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 Anterior and middle cerebral arteries patent bilaterally without significant stenosis. Posterior circulation: Left vertebral artery widely patent to the basilar. Small right vertebral artery is occluded distally due to atherosclerotic plaque. PICA patent bilaterally. Basilar patent. Superior cerebellar and posterior cerebral arteries patent bilaterally. Severe focal stenosis in the right posterior cerebral artery proximally. Left posterior cerebral artery widely patent. Venous sinuses: Patent Anatomic variants: Negative for cerebral aneurysm. Delayed phase: Not performed IMPRESSION: CT perfusion degraded by patient motion causing artifact. No evidence of acute ischemia in the left cerebral hemisphere. Probable artifact right cerebral hemisphere on CT perfusion Mild atherosclerotic disease at the carotid bifurcation bilaterally without significant carotid stenosis in the neck Diffuse atherosclerotic disease throughout the  cavernous carotid bilaterally. Severe stenosis of the supraclinoid internal carotid  artery bilaterally. 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 Right vertebral artery occluded distal to PICA The findings were discussed with Dr. Leonel Ramsay on 11/06/2015 at 1200 hours. Electronically Signed   By: Franchot Gallo M.D.   On: 11/06/2015 12:11   Ct Cerebral Perfusion W Contrast  11/06/2015  CLINICAL DATA:  Right-sided weakness.  Possible stroke. EXAM: CT CEREBRAL PERFUSION WITH CONTRAST TECHNIQUE: The patient was moving during the study degrading image quality. Perfusion maps are degraded by motion. CONTRAST:  40 mL Isovue 370 IV for perfusion. 60 mL Isovue 370 for CT angio. COMPARISON:  CT head 11/06/2015 FINDINGS: No significant perfusion defects in the left cerebral hemisphere. Scattered areas of decreased cerebral blood flow and increased mean transit time in the right cerebral hemisphere which are probably artifactual, given the patient's right-sided weakness. IMPRESSION: Limited study due to motion. No definite left hemispheric acute ischemia. Electronically Signed   By: Franchot Gallo M.D.   On: 11/06/2015 11:58   Ct Head Code Stroke Wo Contrast  11/06/2015  CLINICAL DATA:  75 year old male with unresponsiveness and ectasia. Code stroke. EXAM: CT HEAD WITHOUT CONTRAST TECHNIQUE: Contiguous axial images were obtained from the base of the skull through the vertex without intravenous contrast. COMPARISON:  CT of the head dated 02/10/2014 FINDINGS: There is no acute intracranial hemorrhage. There is stable age-related atrophy and chronic microvascular ischemic changes. A focal area of old infarct and encephalomalacia noted in the left posterior frontal convexity. Left parietal focal old infarct and encephalomalacia changes is also seen. These infarcts are new since study dated 02/10/2014. There is no mass effect or midline shift. The visualized paranasal sinuses and mastoid air cells are clear.  The calvarium is intact. IMPRESSION: No acute intracranial hemorrhage. Age-related atrophy and chronic microvascular ischemic changes. Left posterior frontal and left parietal convexity old infarcts and encephalomalacia. These results were called by telephone at the time of interpretation on 11/06/2015 at 1:41 am to Dr. Wallie Char , who verbally acknowledged these results. Electronically Signed   By: Anner Crete M.D.   On: 11/06/2015 01:43    ASSESSMENT AND PLAN  1. Altered neuro status after Watchman device - he seems better today. CT scan reviewed. Looks like old MCA and basal ganglia infarcts. 2. Atrial fib - his rate appears to be controlled. Will follow.  3. Coags - he is on subcutaneous heparin. Neuro note suggests we can restart anti-coagulation. Will start Eliquis back today. Would anticipate 6 weeks of this before hopefully stopping. No obvious active bleeding at this point.  Romonia Yanik,M.D.  11/08/2015 8:20 AM
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
## 2015-11-08 NOTE — Progress Notes (Signed)
STROKE TEAM PROGRESS NOTE   HISTORY OF PRESENT ILLNESS (per record) Clifford [name] is an 75 y.o. male with a history of stroke with residual aphasia, atrial fibrillation, hypertension, coronary artery disease, hyperlipidemia and diabetes mellitus, who underwent left atrial appendage occlusive procedure earlier today. Patient was noted at about 12:50 AM to be agitated and confused and not following any commands. Speech output was somewhat nonsensical and repetitive. Code stroke was activated for possible acute worsening of expressive aphasia. No focal weakness was noted. CT scan of his head showed no acute intracranial abnormality. Old left frontal and parietal convexity infarctions with encephalomalacia were noted. No seizure activity was reported. Glucose was 97. He was given 1 mg of Ativan IV. He responded well with respect to agitation. There was only a minimal improvement in speech output with short but appropriate responses at times.   SUBJECTIVE (INTERVAL HISTORY) There is no family at the bedside. Patient is improved as far as his agitation and confusion. Patient still with aphasia. No events overnight. He did not have to stay at the nurses station again.  OBJECTIVE Temp:  [97.8 F (36.6 C)-99.8 F (37.7 C)] 99.8 F (37.7 C) (06/25 0620) Pulse Rate:  [74-103] 74 (06/25 0620) Cardiac Rhythm:  [-] Atrial fibrillation (06/25 0732) Resp:  [16-19] 16 (06/25 0620) BP: (101-143)/(56-85) 143/85 mmHg (06/25 0620) SpO2:  [97 %-100 %] 100 % (06/25 0620)  CBC:   Recent Labs Lab 11/05/15 1241  WBC 5.0  HGB 13.5  HCT 39.3  MCV 83.4  PLT 123456    Basic Metabolic Panel:   Recent Labs Lab 11/05/15 1241 11/06/15 0524  NA 139 142  K 3.8 3.7  CL 107 112*  CO2 25 25  GLUCOSE 140* 91  BUN 18 13  CREATININE 0.88 0.74  CALCIUM 9.4 8.4*    Lipid Panel:     Component Value Date/Time   CHOL 99 11/07/2015 0527   TRIG 90 11/07/2015 0527   HDL 27* 11/07/2015 0527   CHOLHDL 3.7 11/07/2015  0527   VLDL 18 11/07/2015 0527   LDLCALC 54 11/07/2015 0527   HgbA1c:  Lab Results  Component Value Date   HGBA1C 8.0* 06/10/2014   Urine Drug Screen: No results found for: LABOPIA, COCAINSCRNUR, LABBENZ, AMPHETMU, THCU, LABBARB    IMAGING  Ct Angio Head and Neck W Or Wo Contrast 11/06/2015   CT perfusion degraded by patient motion causing artifact. No evidence of acute ischemia in the left cerebral hemisphere. Probable artifact right cerebral hemisphere on CT perfusion Mild atherosclerotic disease at the carotid bifurcation bilaterally without significant carotid stenosis in the neck Diffuse atherosclerotic disease throughout the cavernous carotid bilaterally. 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  Chronic changes including LEFT frontoparietal encephalomalacia compatible with old MCA territory infarct and old bilateral basal ganglia lacunar infarcts.    Ct Cerebral Perfusion W Contrast 11/06/2015   Limited study due to motion. No definite left hemispheric acute ischemia.    Ct Head Code Stroke Wo Contrast 11/06/2015   No acute intracranial hemorrhage. Age-related atrophy and chronic microvascular ischemic changes. Left posterior frontal and left parietal convexity old infarcts and encephalomalacia.     No MRI due to pacemaker   Head CT Wo Contrast 11/08/2015:   No acute intracranial process.  Stable appearance of the head including old  LEFT MCA territory infarcts and old bilateral basal ganglia lacunar infarcts.   EEG 11/05/17 EEG Abnormalities:  1) FIRDA 2) generalized irregular slow activity  Clinical Interpretation: This EEG is consistent with a generalized non-specific cerebral dysfunction(encephalopathy). Please note that a normal EEG does not preclude the  possibility of epilepsy.    Transthoracic Cardiac Echocardiogram 11/05/2015 Study Conclusions - Left ventricle: Systolic function was normal. The estimated  ejection fraction was in the range of 50% to 55%. Wall motion was  normal; there were no regional wall motion abnormalities. - Aortic valve: There was mild regurgitation. - Left atrium: No evidence of thrombus in the atrial cavity or appendage. - Right atrium: No evidence of thrombus in the atrial cavity or  appendage. Impressions: - This was a periprocedural TEE during a Watchman device  implantation.   PHYSICAL EXAM Physical exam: Exam: Gen: NAD Eyes: anicteric sclerae, moist conjunctivae                    CV: no MRG, no carotid bruits, no peripheral edema Mental Status: Alert, follows some simple commands, poor historian, improved agitation, still confused  Neuro: Detailed Neurologic Exam  Speech:  No dysarthria. Unclear if patient is aphasic or if there is confusion interfering with his understanding. He is able to talk fluently and is oriented to his name however when asked what month it is he replies and numbers again "6" and asked what year it is he says "12". He follows some simple commands.  Cranial Nerves:    The pupils are equal, round, and reactive to light.. Attempted, Fundi not visualized due to patient noncooperation.  EOMI. Visual fields full to threat. Face symmetric, Tongue midline. Hearing intact to voice. Shoulder shrug intact. Uvula midline.  Motor Observation:    no involuntary movements noted. Tone appears normal.     Strength:    Difficult to assess strength due to noncooperation and confusion/agitation however there is no drift in his arms or his legs and he is able to hold them up antigravity and appears symmetric in strength.     Sensation:  Patient withdraws in all extremities.  DTR: Patient's reflexes appeared asymmetric and brisker on the left however very difficult to say for  certain given patient's noncooperation.  Plantars downgoing.          ASSESSMENT/PLAN Mr. MUIZ [name] is a 75 y.o. male with history of persistent atrial fibrillation, hypertension, rheumatoid arthritis, coronary artery disease, diabetes mellitus, previous stroke, lipidemia, sick sinus syndrome with permanent pacemaker, and medically noncompliant presenting with agitation, confusion, inability to follow commands and speech difficulties following a cardiac procedure.  He did not receive IV t-PA due to recent invasive procedure.  Possible Stroke vs TIA: Dominant possibly embolic related to procedure.  Resultant: Aphasia  MRI  Pacemaker. 2 CTs did not show any acute event but given his symptoms likely embolic small stroke not seen on CT.  MRA  Pacemaker  EEG - (see above) consistent with a generalized non-specific cerebral dysfunction(encephalopathy.  Carotid Doppler - refer to CTA of the neck  2D Echo - EF 50-55%. No cardiac source of emboli identified.  LDL - 54  HgbA1c pending  VTE prophylaxis - subcutaneous heparin Diet heart healthy/carb modified Room service appropriate?: Yes; Fluid consistency:: Thin  Patient has been on Eliquis with non-compliance, aspirin 81 mg daily prior to admission, now on aspirin 81 mg daily. Given his atrial fibrillation, we recommend starting Eliquis for stroke prevention.  Given his history  of noncompliance and prior GI bleeding, we will defer decision  to cardiology.  Patient counseled to be compliant with his antithrombotic medications  Ongoing aggressive stroke risk factor management  Therapy recommendations: pending   Disposition:  Pending  Hypertension  Stable - blood pressure runs mildly low  Permissive hypertension (OK if < 220/120) but gradually normalize in 5-7 days  Long-term BP goal normotensive  Hyperlipidemia  Home meds:  Mevacor 40 mg daily resumed in hospital  LDL 54, goal < 70  Now on Pravachol 40 mg  daily  Continue statin at discharge  Diabetes  HgbA1c pending, goal < 7.0   Other Stroke Risk Factors  Advanced age  Cigarette smoker - quit smoking 20 years ago  Hx stroke/TIA  Coronary artery disease   Other Active Problems  Possible seizure activity - EEG as above - currently on Keppra  Unable to have MRI secondary to permanent pacemaker - repeat head CT in a.m.  Agitation: The patient has been agitated and is currently at the nurse's station. The nurses would like something IV for acute agitation, I discussed that they can give him IV Ativan very low dose only as needed and if patient cannot be redirected or calmed down as this can worsen confusion and somnolence.    Hospital day # 3   Personally examined patient and images, and have participated in and made any corrections needed to history, physical, neuro exam,assessment and plan as stated above.  I have personally obtained the history, evaluated lab date, reviewed imaging studies and agree with radiology interpretations. Patient likely had a stroke to small to be seen on CT. We recommend restarting Eliquis for stroke prevention. Follow up with Dr. Erlinda Hong in 2 months. Stroke team will sign off at this time.   Sarina Ill, MD Stroke Neurology [phone] [facility]      To contact Stroke Continuity provider, please refer to [email or web address] After hours, contact General Neurology

## 2015-11-08 NOTE — Progress Notes (Signed)
ANTICOAGULATION CONSULT NOTE - Initial Consult  Pharmacy Consult for Eliquis Indication: Short-term anticoagulation s/p watchman device implantation  Allergies  Allergen Reactions  . Demerol Nausea And Vomiting    Patient Measurements: Height: 5\' 9"  (175.3 cm) Weight: 168 lb 14 oz (76.6 kg) IBW/kg (Calculated) : 70.7  Vital Signs: Temp: 99.8 F (37.7 C) (06/25 0620) Temp Source: Oral (06/25 0620) BP: 143/85 mmHg (06/25 0620) Pulse Rate: 74 (06/25 0620)  Labs:  Recent Labs  11/05/15 1241 11/06/15 0524  HGB 13.5  --   HCT 39.3  --   PLT 215  --   CREATININE 0.88 0.74    Estimated Creatinine Clearance: 79.8 mL/min (by C-G formula based on Cr of 0.74).   Medical History: Past Medical History  Diagnosis Date  . Persistent atrial fibrillation ([place])   . Hypertensive cardiovascular disease   . Rheumatoid arthritis(714.0)   . CAD (coronary artery disease)   . Goiter   . Sick sinus syndrome ([place])   . GERD (gastroesophageal reflux disease)   . Medically noncompliant   . High cholesterol   . Hypothyroidism   . Type II diabetes mellitus ([facility])     fasting cbg 150-180  . Stroke [facility]) 02/2014    "left sided weakness since" (11/05/2015)  . Chronic lower back pain    Assessment: 29 yom admitted 11/05/2015 with atrial fibrillation and No AC PTA. Pharmacy consulted to dose apixaban for short-term anticoagulation after watchman implantation on 6/22. (chads2vasc score is at least 6). Per patient's age, weight and renal function, will start patient on 5mg  BID. Not a coumadin candidate per EP MD. Plan for 6 weeks of therapy with eliquis.  Hgb 13.5, PLT 215  Goal of Therapy:  Monitor platelets by anticoagulation protocol: Yes   Plan:  Apixaban 5mg  BID Monitor for s/s of bleeding   Ilhan Debenedetto C. Lennox Grumbles, PharmD Pharmacy Resident  Pager: [phone] 11/08/2015 8:59 AM

## 2015-11-09 DIAGNOSIS — R531 Weakness: Secondary | ICD-10-CM | POA: Insufficient documentation

## 2015-11-09 LAB — GLUCOSE, CAPILLARY
GLUCOSE-CAPILLARY: 160 mg/dL — AB (ref 65–99)
Glucose-Capillary: 159 mg/dL — ABNORMAL HIGH (ref 65–99)
Glucose-Capillary: 206 mg/dL — ABNORMAL HIGH (ref 65–99)
Glucose-Capillary: 105 mg/dL — ABNORMAL HIGH (ref 65–99)

## 2015-11-09 LAB — HEMOGLOBIN A1C
Hgb A1c MFr Bld: 6.1 % — ABNORMAL HIGH (ref 4.8–5.6)
Mean Plasma Glucose: 128 mg/dL

## 2015-11-09 NOTE — Evaluation (Signed)
Physical Therapy Evaluation Patient Details Name: Clifford [name] MRN: [id number] DOB: 14-Mar-1941 Today's Date: 11/09/2015   History of Present Illness  Clifford [name] is an 75 y.o. male with a history of stroke with residual aphasia, atrial fibrillation, hypertension, coronary artery disease, hyperlipidemia and diabetes mellitus, who underwent left atrial appendage occlusive procedure and later became agitated and confused and not following any commands. Speech output was somewhat nonsensical and repetitive. Code stroke was activated for possible acute worsening of expressive aphasia. No focal weakness was noted. CT scan of his head showed no acute intracranial abnormality. Old left frontal and parietal convexity infarctions with encephalomalacia . PMH includes A fib, HTN, DM, RA, CAD, goiter and CVA affecting L side    Clinical Impression  Pt admitted with above diagnosis. Pt currently with functional limitations due to the deficits listed below (see PT Problem List).  Pt will benefit from skilled PT to increase their independence and safety with mobility to allow discharge to the venue listed below.  Pt requires MAX A for SPT and with L LE weaker than R LE and decreased coordination/use of R UE.  Recommend SNF and pt and daughter in agreement.     Follow Up Recommendations SNF;Supervision/Assistance - 24 hour;Supervision for mobility/OOB    Equipment Recommendations  None recommended by PT    Recommendations for Other Services       Precautions / Restrictions Precautions Precautions: Fall Restrictions Weight Bearing Restrictions: No      Mobility  Bed Mobility Overal bed mobility: Needs Assistance Bed Mobility: Supine to Sit     Supine to sit: Min assist     General bed mobility comments: Able to initiate transfer, but needed MIN A to complete and get fully to EOB  Transfers Overall transfer level: Needs assistance Equipment used: Rolling walker (2 wheeled) Transfers: Sit  to/from [facility] Sit to Stand: Mod assist         General transfer comment: Stood to RW with MOD A.  A to get R hand placed on RW.  In standing, was unable to coordinate steps so returned sitting.  Moved RW and did a stand pivot transfer with MAX A with pt reaching for arm rest.  Most difficulty with coordinating L LE.  Ambulation/Gait             General Gait Details: Unable to coordinate  Stairs            Wheelchair Mobility    Modified Rankin (Stroke Patients Only) Modified Rankin (Stroke Patients Only) Pre-Morbid Rankin Score: Moderate disability Modified Rankin: Severe disability     Balance Overall balance assessment: Needs assistance Sitting-balance support: Feet supported Sitting balance-Leahy Scale: Fair       Standing balance-Leahy Scale: Poor Standing balance comment: requires UE support and poor coordination                             Pertinent Vitals/Pain Pain Assessment: No/denies pain    Home Living Family/patient expects to be discharged to:: Skilled nursing facility Living Arrangements: Alone                    Prior Function Level of Independence: Independent with assistive device(s)         Comments: Amb with cane     Hand Dominance   Dominant Hand: Right    Extremity/Trunk Assessment   Upper Extremity Assessment: Defer to OT evaluation (noted decreased  coordination with R hand)           Lower Extremity Assessment: RLE deficits/detail;LLE deficits/detail RLE Deficits / Details: grossly 4-/5 LLE Deficits / Details: gorssly 3+/5  Cervical / Trunk Assessment: Normal  Communication   Communication: Expressive difficulties (some prior to admission per daughter)  Cognition Arousal/Alertness: Awake/alert Behavior During Therapy: WFL for tasks assessed/performed;Flat affect Overall Cognitive Status: Impaired/Different from baseline Area of Impairment:  Orientation;Safety/judgement;Problem solving;Following commands Orientation Level: Disoriented to;Time   Memory: Decreased short-term memory Following Commands: Follows one step commands with increased time Safety/Judgement: Decreased awareness of safety;Decreased awareness of deficits   Problem Solving: Slow processing;Decreased initiation;Difficulty sequencing;Requires verbal cues;Requires tactile cues      General Comments General comments (skin integrity, edema, etc.): Pt extrememly fatigued with SPT. Daughter present and supportive.    Exercises        Assessment/Plan    PT Assessment Patient needs continued PT services  PT Diagnosis Generalized weakness;Abnormality of gait   PT Problem List Decreased strength;Decreased activity tolerance;Decreased balance;Decreased mobility;Decreased coordination;Decreased knowledge of use of DME;Decreased safety awareness  PT Treatment Interventions Gait training;Functional mobility training;Therapeutic activities;Therapeutic exercise;Patient/family education;Neuromuscular re-education;Balance training;DME instruction   PT Goals (Current goals can be found in the Care Plan section) Acute Rehab PT Goals Patient Stated Goal: to go to rehab PT Goal Formulation: With patient/family Time For Goal Achievement: 11/16/15 Potential to Achieve Goals: Good    Frequency Min 3X/week   Barriers to discharge        Co-evaluation               End of Session Equipment Utilized During Treatment: Gait belt Activity Tolerance: Patient limited by fatigue Patient left: in chair;with call bell/phone within reach;with chair alarm set;with family/visitor present Nurse Communication: Mobility status         Time: [id number] PT Time Calculation (min) (ACUTE ONLY): 27 min   Charges:   PT Evaluation $PT Eval Moderate Complexity: 1 Procedure PT Treatments $Therapeutic Activity: 8-22 mins   PT G Codes:        Aeriel Boulay LUBECK 11/09/2015,  1:07 PM

## 2015-11-09 NOTE — Progress Notes (Signed)
SUBJECTIVE: The patient is significantly improved from Friday.  At this time, he denies chest pain, shortness of breath, or any new concerns.  CURRENT MEDICATIONS: . apixaban  5 mg Oral BID  . aspirin EC  81 mg Oral Daily  . famotidine  20 mg Oral Daily  . folic acid  1 mg Oral Daily  . glipiZIDE  10 mg Oral Daily  . hydrochlorothiazide  25 mg Oral Daily  . insulin aspart  0-15 Units Subcutaneous TID WC  . levETIRAcetam  500 mg Intravenous Q12H  . levothyroxine  100 mcg Oral QAC breakfast  . lisinopril  20 mg Oral Daily  . pravastatin  40 mg Oral q1800  . sodium chloride flush  3 mL Intravenous Q12H   . sodium chloride 1,000 mL (11/09/15 0646)    OBJECTIVE: Physical Exam: Filed Vitals:   11/08/15 1837 11/08/15 2100 11/09/15 0211 11/09/15 0553  BP: 128/68 134/73 139/79 132/59  Pulse: 78 82 64 65  Temp: 98 F (36.7 C) 98 F (36.7 C) 98.1 F (36.7 C) 97.9 F (36.6 C)  TempSrc: Oral Oral Oral Oral  Resp: 18 18 18 20   Height:      Weight:      SpO2: 100% 100% 98% 98%    Intake/Output Summary (Last 24 hours) at 11/09/15 0719 Last data filed at 11/09/15 [id number]  Gross per 24 hour  Intake     60 ml  Output   1130 ml  Net  -1070 ml    Telemetry reveals rate controlled AF   GEN- The patient is well appearing, alert and oriented to person and place, thinks it is 2009 Head- normocephalic, atraumatic Eyes-  Sclera clear, conjunctiva pink Ears- hearing intact Oropharynx- clear Neck- supple  Lungs- Clear to ausculation bilaterally, normal work of breathing Heart- Irregular rate and rhythm  GI- soft, NT, ND, + BS Extremities- no clubbing, cyanosis, or edema Skin- no rash or lesion Psych- euthymic mood, full affect Neuro- strength and sensation are intact   RADIOLOGY: Ct Angio Head W Or Wo Contrast 11/06/2015  CLINICAL DATA:  Code stroke. Right-sided weakness. Cardiac catheterization 11/05/2015 EXAM: CT ANGIOGRAPHY HEAD AND NECK TECHNIQUE: Multidetector CT imaging of  the head and neck was performed using the standard protocol during bolus administration of intravenous contrast. Multiplanar CT image reconstructions and MIPs were obtained to evaluate the vascular anatomy. Carotid stenosis measurements (when applicable) are obtained utilizing NASCET criteria, using the distal internal carotid diameter as the denominator. CONTRAST:  40 mL Isovue 370 for CT perfusion. 60 mL Isovue 370 for CT angio COMPARISON:  CT head 11/06/2015 FINDINGS: CTA NECK Aortic arch: Mild atherosclerotic calcification aortic arch and proximal great vessels. Proximal great vessels widely patent. Pacemaker leads noted. Moderate to advanced apical emphysema. Right carotid system: Right common carotid artery widely patent. Mild atherosclerotic calcification right carotid bifurcation without significant carotid stenosis. Left carotid system: Left common carotid artery widely patent. Mild atherosclerotic calcification at the carotid bifurcation without significant carotid stenosis. Vertebral arteries:Left vertebral dominant. Left vertebral dominant. Left vertebral artery widely patent to the basilar without stenosis. Right vertebral artery mild stenosis at the level of C1. Occlusion of the distal right vertebral artery past PICA due to calcific plaque. Skeleton: Cervical spondylosis.  No fracture or bony lesion. Other neck: Thyroidectomy clips.  No mass or adenopathy in the neck. CTA HEAD Anterior circulation: Extensive atherosclerotic calcification in the cavernous carotid bilaterally causing mild to moderate stenosis bilaterally. Severe stenosis of the supraclinoid internal carotid  artery bilaterally. Anterior and middle cerebral arteries patent bilaterally without significant stenosis. Posterior circulation: Left vertebral artery widely patent to the basilar. Small right vertebral artery is occluded distally due to atherosclerotic plaque. PICA patent bilaterally. Basilar patent. Superior cerebellar and  posterior cerebral arteries patent bilaterally. Severe focal stenosis in the right posterior cerebral artery proximally. Left posterior cerebral artery widely patent. Venous sinuses: Patent Anatomic variants: Negative for cerebral aneurysm. Delayed phase: Not performed IMPRESSION: CT perfusion degraded by patient motion causing artifact. No evidence of acute ischemia in the left cerebral hemisphere. Probable artifact right cerebral hemisphere on CT perfusion Mild atherosclerotic disease at the carotid bifurcation bilaterally without significant carotid stenosis in the neck Diffuse atherosclerotic disease throughout the cavernous carotid bilaterally. 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 Right vertebral artery occluded distal to PICA The findings were discussed with Dr. Leonel Ramsay on 11/06/2015 at 1200 hours. Electronically Signed   By: Franchot Gallo M.D.   On: 11/06/2015 12:11   Ct Head Wo Contrast 11/08/2015  CLINICAL DATA:  Confusion and agitation. Inability to follow commands with speech difficulty after cardiac procedure. Stroke. History of hypertension, diabetes. EXAM: CT HEAD WITHOUT CONTRAST TECHNIQUE: Contiguous axial images were obtained from the base of the skull through the vertex without intravenous contrast. COMPARISON:  CT HEAD November 07, 2015 FINDINGS: INTRACRANIAL CONTENTS: The ventricles and sulci are normal for age. No intraparenchymal hemorrhage, mass effect nor midline shift. Patchy supratentorial white matter hypodensities are within normal range for patient's age and though non-specific likely represent chronic small vessel ischemic disease. Re- demonstration of LEFT frontal and LEFT parietal lobe encephalomalacia. Old bilateral basal ganglia lacunar infarcts. No acute large vascular territory infarcts. No abnormal extra-axial fluid collections. Basal cisterns are patent. Moderate calcific  atherosclerosis of the carotid siphons. ORBITS: The included ocular globes and orbital contents are non-suspicious. SINUSES: Trace paranasal sinus mucosal thickening without air-fluid levels. The mastoid air cells are well aerated. SKULL/SOFT TISSUES: No skull fracture. No significant soft tissue swelling. Patient is edentulous. LEFT suboccipital sebaceous cyst. IMPRESSION: No acute intracranial process. Stable appearance of the head including old LEFT MCA territory infarcts and old bilateral basal ganglia lacunar infarcts. Electronically Signed   By: Elon Alas M.D.   On: 11/08/2015 03:44     ASSESSMENT AND PLAN:  Active Problems:   Atrial fibrillation (HCC)   Aphasia   Transient cerebral ischemia   Cerebrovascular accident (CVA) due to embolism ([place])   1.  Permanent atrial fibrillation S/p unsuccessful Watchman implant 11/05/15 Continue Eliquis for CHADS2VASC of 6 BMET, CBC in am Rates controlled  2.  Suspected CVA CT scans with no acute event, no MRI with pacemaker Will ask Dr Leonie Man to see patient again today  Continue Eliquis and ASA for now PT/OT to see today Discussed with stroke team - will discontinue Keppra  3.  SSS S/p PPM  4.  HTN Stable No change required today  Will ask PT and OT to see today. I suspect he will need SNF as he lives alone - he states he is agreeable to this when I talked with him today. Hopefully discharge tomorrow.  [name], NP 11/09/2015 7:23 AM    I have seen, examined the patient, and reviewed the above assessment and plan.  On exam, alert, interactive and following commands.  R arm is weak but strength is much improved.  Changes to above are made where necessary. Physical therapy to assess today.  Possibly  discharge tomorrow.    Co Sign: Thompson Grayer, MD 11/09/2015 2:16 PM

## 2015-11-09 NOTE — Care Management Important Message (Signed)
Important Message  Patient Details  Name: Clifford [name] MRN: [id number] Date of Birth: [birth year]   Medicare Important Message Given:  Yes    Loann Quill 11/09/2015, 8:54 AM

## 2015-11-09 NOTE — Clinical Social Work Note (Signed)
Clinical Social Work Assessment  Patient Details  Name: JESSON FOSKEY MRN: [id number] Date of Birth: 25-Mar-1941  Date of referral:  11/09/15               Reason for consult:  Facility Placement                Permission sought to share information with:  Facility Sport and exercise psychologist, Family Supports Permission granted to share information::  Yes, Verbal Permission Granted  Name::     Carleene Cooper::  SNFs  Relationship::  Daughter  Contact Information:  [phone]  Housing/Transportation Living arrangements for the past 2 months:  Benjamin Perez of Information:  Patient, Adult Children Patient Interpreter Needed:  None Criminal Activity/Legal Involvement Pertinent to Current Situation/Hospitalization:  No - Comment as needed Significant Relationships:  Adult Children Lives with:  Self Do you feel safe going back to the place where you live?  No Need for family participation in patient care:  Yes (Comment)  Care giving concerns:  CSW received referral for possible SNF placement at time of discharge. CSW met with patient and patient's daughter at bedside regarding recommendation of SNF placement at time of discharge. Per patient's daughter, patient lives alone and is currently unable to care for himself at home given patient's current physical needs and fall risk. Patient and patient's daughter expressed understanding of waiting on PT recommendation and are agreeable to SNF placement at time of discharge. CSW to continue to follow and assist with discharge planning needs.  Social Worker assessment / plan:  CSW spoke with patient and patient's daughter concerning possibility of rehab at [facility] before returning home.  Employment status:  Retired Nurse, adult PT Recommendations:  Not assessed at this time, [place] / Referral to community resources:  [place]  Patient/Family's Response to care:  Patient  and patient's daughter recognize need for rehab before returning home and are agreeable to a SNF. Patient reported preference for SNF near [place] (daughter lives in [place]) .  Patient/Family's Understanding of and Emotional Response to Diagnosis, Current Treatment, and Prognosis:  Patient is realistic regarding therapy needs. No questions/concerns about plan or treatment.    Emotional Assessment Appearance:  Appears stated age Attitude/Demeanor/Rapport:  Other (Appropriate) Affect (typically observed):  Accepting, Pleasant, Appropriate Orientation:  Oriented to Self, Oriented to Place, Oriented to Situation Alcohol / Substance use:  Not Applicable Psych involvement (Current and /or in the community):  No (Comment)  Discharge Needs  Concerns to be addressed:  No discharge needs identified, Care Coordination Readmission within the last 30 days:  No Current discharge risk:  None Barriers to Discharge:  Continued Medical Work up   [employer], [place] 11/09/2015, 12:16 PM

## 2015-11-09 NOTE — NC FL2 (Signed)
[place] LEVEL OF CARE SCREENING TOOL     IDENTIFICATION  Patient Name: Clifford [name] Birthdate: 03-14-1941 Sex: male Admission Date (Current Location): 11/05/2015  [facility] and [place] Number:  Herbalist and Address:  [facility], [place] [address], [place], [place] [address]      Provider Number: [id number]  Attending Physician Name and Address:  Sherren Mocha, MD  Relative Name and Phone Number:  Rod Holler daughter, [phone]    Current Level of Care: Hospital Recommended Level of Care: [place] Prior Approval Number:    Date Approved/Denied:   PASRR Number: [id number]  Discharge Plan: SNF    Current Diagnoses: Patient Active Problem List   Diagnosis Date Noted  . Cerebrovascular accident (CVA) due to embolism ([place])   . Transient cerebral ischemia   . Aphasia   . Persistent atrial fibrillation ([place])   . Sick sinus syndrome ([place]) 08/12/2014  . 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 CVA (cerebral infarction) 02/08/2014  . Diabetes mellitus ([place]) 02/08/2014  . Hyperlipidemia 04/05/2012  . Hypotension 12/25/2010  . Chronic anticoagulation 12/25/2010  . WEAKNESS 02/24/2010  . ABNORMAL ELECTROCARDIOGRAM 06/16/2009  . AODM 06/20/2008  . HYPERTENSION, BENIGN 06/20/2008  . CAD 06/20/2008  . Atrial fibrillation ([place]) 06/20/2008  . PPM-Medtronic 06/20/2008    Orientation RESPIRATION BLADDER Height & Weight     Self, Situation, Place  Normal Continent Weight: 76.6 kg (168 lb 14 oz) Height:  5\' 9"  (175.3 cm)  BEHAVIORAL SYMPTOMS/MOOD NEUROLOGICAL BOWEL NUTRITION STATUS      Continent Diet (Please see DC summary)  AMBULATORY STATUS COMMUNICATION OF NEEDS Skin   Extensive Assist Verbally Normal                       Personal Care Assistance Level of Assistance  Bathing, Feeding, Dressing Bathing Assistance: Limited assistance Feeding assistance: Limited  assistance Dressing Assistance: Limited assistance     Functional Limitations Info  Sight Sight Info: Impaired        SPECIAL CARE FACTORS FREQUENCY  PT (By licensed PT)     PT Frequency: min 3x/week              Contractures      Additional Factors Info  Code Status, Allergies Code Status Info: Full Allergies Info: Demerol           Current Medications (11/09/2015):  This is the current hospital active medication list Current Facility-Administered Medications  Medication Dose Route Frequency Provider Last Rate Last Dose  . acetaminophen (TYLENOL) tablet 650 mg  650 mg Oral Q4H PRN Sherren Mocha, MD   650 mg at 11/07/15 1702  . apixaban (ELIQUIS) tablet 5 mg  5 mg Oral BID Conrad Corcovado, RPH   5 mg at 11/09/15 1043  . aspirin EC tablet 81 mg  81 mg Oral Daily Sherren Mocha, MD   81 mg at 11/09/15 1043  . famotidine (PEPCID) tablet 20 mg  20 mg Oral Daily Sherren Mocha, MD   20 mg at 11/09/15 1043  . folic acid (FOLVITE) tablet 1 mg  1 mg Oral Daily Sherren Mocha, MD   1 mg at 11/09/15 1043  . glipiZIDE (GLUCOTROL) tablet 10 mg  10 mg Oral Daily Sherren Mocha, MD   10 mg at 11/09/15 1044  . hydrochlorothiazide (HYDRODIURIL) tablet 25 mg  25 mg Oral Daily Sherren Mocha, MD   25 mg at 11/09/15  1043  . insulin aspart (novoLOG) injection 0-15 Units  0-15 Units Subcutaneous TID WC Sherren Mocha, MD   5 Units at 11/09/15 1157  . levothyroxine (SYNTHROID, LEVOTHROID) tablet 100 mcg  100 mcg Oral QAC breakfast Sherren Mocha, MD   100 mcg at 11/09/15 0645  . lisinopril (PRINIVIL,ZESTRIL) tablet 20 mg  20 mg Oral Daily Sherren Mocha, MD   20 mg at 11/09/15 1044  . LORazepam (ATIVAN) injection 0.25 mg  0.25 mg Intravenous Q6H PRN Melvenia Beam, MD   0.25 mg at 11/08/15 1638  . nitroGLYCERIN (NITROSTAT) SL tablet 0.4 mg  0.4 mg Sublingual Q5 min PRN Sherren Mocha, MD      . ondansetron [facility]) injection 4 mg  4 mg Intravenous Q6H PRN Sherren Mocha, MD      .  pravastatin (PRAVACHOL) tablet 40 mg  40 mg Oral q1800 Sherren Mocha, MD   40 mg at 11/08/15 1733  . sodium chloride flush (NS) 0.9 % injection 3 mL  3 mL Intravenous Q12H Sherren Mocha, MD   3 mL at 11/07/15 1000  . sodium chloride flush (NS) 0.9 % injection 3 mL  3 mL Intravenous PRN Sherren Mocha, MD         Discharge Medications: Please see discharge summary for a list of discharge medications.  Relevant Imaging Results:  Relevant Lab Results:   Additional Information SSN: [place], [place]

## 2015-11-09 NOTE — Progress Notes (Signed)
STROKE TEAM PROGRESS NOTE   HISTORY OF PRESENT ILLNESS (per record) Clifford [name] is an 75 y.o. male with a history of stroke with residual aphasia, atrial fibrillation, hypertension, coronary artery disease, hyperlipidemia and diabetes mellitus, who underwent left atrial appendage occlusive procedure earlier today. Patient was noted at about 12:50 AM to be agitated and confused and not following any commands. Speech output was somewhat nonsensical and repetitive. Code stroke was activated for possible acute worsening of expressive aphasia. No focal weakness was noted. CT scan of his head showed no acute intracranial abnormality. Old left frontal and parietal convexity infarctions with encephalomalacia were noted. No seizure activity was reported. Glucose was 97. He was given 1 mg of Ativan IV. He responded well with respect to agitation. There was only a minimal improvement in speech output with short but appropriate responses at times.   SUBJECTIVE (INTERVAL HISTORY) There is daughter at the bedside. Patient is improved as far as his agitation and confusion. Patient has mild right hand weakness. No events overnight.Ct head x 2 no acute stroke.  OBJECTIVE Temp:  [97.9 F (36.6 C)-98.3 F (36.8 C)] 98.3 F (36.8 C) (06/26 1457) Pulse Rate:  [64-88] 77 (06/26 1457) Cardiac Rhythm:  [-] Atrial fibrillation (06/26 0747) Resp:  [18-20] 20 (06/26 1457) BP: (116-139)/(57-79) 116/57 mmHg (06/26 1457) SpO2:  [97 %-100 %] 98 % (06/26 1457)  CBC:   Recent Labs Lab 11/05/15 1241  WBC 5.0  HGB 13.5  HCT 39.3  MCV 83.4  PLT 123456    Basic Metabolic Panel:   Recent Labs Lab 11/05/15 1241 11/06/15 0524  NA 139 142  K 3.8 3.7  CL 107 112*  CO2 25 25  GLUCOSE 140* 91  BUN 18 13  CREATININE 0.88 0.74  CALCIUM 9.4 8.4*    Lipid Panel:     Component Value Date/Time   CHOL 99 11/07/2015 0527   TRIG 90 11/07/2015 0527   HDL 27* 11/07/2015 0527   CHOLHDL 3.7 11/07/2015 0527   VLDL 18  11/07/2015 0527   LDLCALC 54 11/07/2015 0527   HgbA1c:  Lab Results  Component Value Date   HGBA1C 6.1* 11/07/2015   Urine Drug Screen: No results found for: LABOPIA, COCAINSCRNUR, LABBENZ, AMPHETMU, THCU, LABBARB    IMAGING  Ct Angio Head and Neck W Or Wo Contrast 11/06/2015   CT perfusion degraded by patient motion causing artifact. No evidence of acute ischemia in the left cerebral hemisphere. Probable artifact right cerebral hemisphere on CT perfusion Mild atherosclerotic disease at the carotid bifurcation bilaterally without significant carotid stenosis in the neck Diffuse atherosclerotic disease throughout the cavernous carotid bilaterally. 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  Chronic changes including LEFT frontoparietal encephalomalacia compatible with old MCA territory infarct and old bilateral basal ganglia lacunar infarcts.    Ct Cerebral Perfusion W Contrast 11/06/2015   Limited study due to motion. No definite left hemispheric acute ischemia.    Ct Head Code Stroke Wo Contrast 11/06/2015   No acute intracranial hemorrhage. Age-related atrophy and chronic microvascular ischemic changes. Left posterior frontal and left parietal convexity old infarcts and encephalomalacia.     No MRI due to pacemaker   Head CT Wo Contrast 11/08/2015:   No acute intracranial process.  Stable appearance of the head including old LEFT MCA territory infarcts  and old bilateral basal ganglia lacunar infarcts.   EEG 11/05/17 EEG Abnormalities:  1) FIRDA 2) generalized irregular slow activity  Clinical Interpretation: This EEG is consistent with a generalized non-specific cerebral dysfunction(encephalopathy). Please note that a normal EEG does not preclude the possibility of  epilepsy.    Transthoracic Cardiac Echocardiogram 11/05/2015 Study Conclusions - Left ventricle: Systolic function was normal. The estimated  ejection fraction was in the range of 50% to 55%. Wall motion was  normal; there were no regional wall motion abnormalities. - Aortic valve: There was mild regurgitation. - Left atrium: No evidence of thrombus in the atrial cavity or appendage. - Right atrium: No evidence of thrombus in the atrial cavity or  appendage. Impressions: - This was a periprocedural TEE during a Watchman device  implantation.   PHYSICAL EXAM Physical exam: Exam: Gen: NAD Eyes: anicteric sclerae, moist conjunctivae                    CV: no MRG, no carotid bruits, no peripheral edema Mental Status: Alert, follows some simple commands, poor historian, improved agitation, still confused  Neuro: Detailed Neurologic Exam  Speech:  No dysarthria. Unclear if patient is aphasic or if there is confusion interfering with his understanding. He is able to talk fluently and is oriented to his name  only. He follows some simple commands.  Cranial Nerves:    The pupils are equal, round, and reactive to light.. Attempted, Fundi not visualized due to patient noncooperation.  EOMI. Visual fields full to threat. Face symmetric, Tongue midline. Hearing intact to voice. Shoulder shrug intact. Uvula midline.  Motor Observation:    no involuntary movements noted. Tone appears normal.     Strength:     there is no drift in his arms or his legs and he is able to hold them up antigravity and appears symmetric in strength except mild right hand weakness and diminished fine finger movements on right.     Sensation:  Patient withdraws in all extremities.  DTR: Patient's reflexes appeared asymmetric and brisker on the left however very difficult to say for certain given patient's noncooperation.  Plantars downgoing.          ASSESSMENT/PLAN [name] is a 75  y.o. male with history of persistent atrial fibrillation, hypertension, rheumatoid arthritis, coronary artery disease, diabetes mellitus, previous stroke, lipidemia, sick sinus syndrome with permanent pacemaker, and medically noncompliant presenting with agitation, confusion, inability to follow commands and speech difficulties following a cardiac procedure.  He did not receive IV t-PA due to recent invasive procedure.  Possible Small left brain stroke not visualized on Ct head x2 and cannot do MRI due to pacemaker likely embolic related to cardiac attempted watchman procedure.  Resultant: Mild right hand weakness  MRI  Pacemaker. 2 CTs did not show any acute event but given his symptoms likely embolic small stroke not seen on CT.  MRA  Pacemaker  EEG - (see above) consistent with a generalized non-specific cerebral dysfunction(encephalopathy.  Carotid Doppler - refer to CTA of the neck  2D Echo - EF 50-55%. No cardiac source of emboli identified.  LDL - 54  HgbA1c 6.1  VTE prophylaxis - subcutaneous heparin Diet heart healthy/carb modified Room service appropriate?: Yes; Fluid consistency:: Thin  Patient has been on Eliquis with non-compliance, aspirin 81 mg daily prior to admission, now on aspirin 81 mg daily. Given his atrial fibrillation, we recommend starting Eliquis for stroke prevention.  Given his history of noncompliance  and prior GI bleeding, we will defer decision  to cardiology.  Patient counseled to be compliant with his antithrombotic medications  Ongoing aggressive stroke risk factor management  Therapy recommendations: pending   Disposition:  Pending  Hypertension  Stable - blood pressure runs mildly low  Permissive hypertension (OK if < 220/120) but gradually normalize in 5-7 days  Long-term BP goal normotensive  Hyperlipidemia  Home meds:  Mevacor 40 mg daily resumed in hospital  LDL 54, goal < 70  Now on Pravachol 40 mg daily  Continue statin at  discharge  Diabetes  HgbA1c 6.1, goal < 7.0   Other Stroke Risk Factors  Advanced age  Cigarette smoker - quit smoking 20 years ago  Hx stroke/TIA  Coronary artery disease   Other Active Problems  Possible seizure activity - EEG as above - currently on Keppra  Unable to have MRI secondary to permanent pacemaker - repeat head CT in a.m. Agitation: etiology unclear possibly medication effect versus small  left  brain infarct not seen on Ct head x 2   Hospital day # 4   Personally examined patient and images, and have participated in and made any corrections needed to history, physical, neuro exam,assessment and plan as stated above.  I have personally obtained the history, evaluated lab date, reviewed imaging studies and agree with radiology interpretations. Patient likely had a stroke to small to be seen on CT. We recommend restarting Eliquis for stroke prevention. Follow up with Dr. Erlinda Hong in 2 months. Greater than 50% of time during this 25 minute visit was spent on counseling and coordination of care about stroke risk and prevention Stroke team will sign off at this time.d/w cardiac NP Amber Marlynn Perking, MD Stroke Neurology [phone] [facility]      To contact Stroke Continuity provider, please refer to [email or web address] After hours, contact General Neurology

## 2015-11-09 NOTE — Discharge Instructions (Signed)

## 2015-11-09 NOTE — Care Management Note (Addendum)
Case Management Note  Patient Details  Name: CHIAM FLOREZ MRN: [id number] Date of Birth: 11-13-1940  Subjective/Objective:      Pt in with Afib. He is from home alone. Pt started on Eliquis in the hospital.               Action/Plan: Awaiting PT/OT recommendations.  Benefits check done on Eliquis 5 mg BID with result: CO-PAY- $ 47.00 (Q/L 60 TAB ). CM will provide the 30 day free card prior to d/c. CM will continue to follow for d/c needs.   Addendum: 30 day free card given to patients daughter and notified of $47/ month. Daughter going to call to see if patient can receive additional assistance with Eliquis from company.   Expected Discharge Date:                  Expected Discharge Plan:     In-House Referral:     Discharge planning Services     Post Acute Care Choice:    Choice offered to:     DME Arranged:    DME Agency:     HH Arranged:    HH Agency:     Status of Service:  In process, will continue to follow  If discussed at Long Length of Stay Meetings, dates discussed:    Additional Comments:  Pollie Friar, RN 11/09/2015, 12:31 PM

## 2015-11-10 DIAGNOSIS — R4701 Aphasia: Secondary | ICD-10-CM

## 2015-11-10 DIAGNOSIS — I6310 Cerebral infarction due to embolism of unspecified precerebral artery: Secondary | ICD-10-CM

## 2015-11-10 DIAGNOSIS — M6289 Other specified disorders of muscle: Secondary | ICD-10-CM

## 2015-11-10 LAB — CBC WITH DIFFERENTIAL/PLATELET
BASOS ABS: 0 10*3/uL (ref 0.0–0.1)
BASOS PCT: 1 %
Eosinophils Absolute: 0.3 10*3/uL (ref 0.0–0.7)
Eosinophils Relative: 6 %
HEMATOCRIT: 32.6 % — AB (ref 39.0–52.0)
HEMOGLOBIN: 11.5 g/dL — AB (ref 13.0–17.0)
Lymphocytes Relative: 41 %
Lymphs Abs: 1.9 10*3/uL (ref 0.7–4.0)
MCH: 28.8 pg (ref 26.0–34.0)
MCHC: 35.3 g/dL (ref 30.0–36.0)
MCV: 81.7 fL (ref 78.0–100.0)
Monocytes Absolute: 0.8 10*3/uL (ref 0.1–1.0)
Monocytes Relative: 18 %
NEUTROS ABS: 1.5 10*3/uL — AB (ref 1.7–7.7)
NEUTROS PCT: 34 %
Platelets: 214 10*3/uL (ref 150–400)
RBC: 3.99 MIL/uL — ABNORMAL LOW (ref 4.22–5.81)
RDW: 13.4 % (ref 11.5–15.5)
WBC: 4.5 10*3/uL (ref 4.0–10.5)

## 2015-11-10 LAB — BASIC METABOLIC PANEL
ANION GAP: 7 (ref 5–15)
BUN: 7 mg/dL (ref 6–20)
CALCIUM: 9.4 mg/dL (ref 8.9–10.3)
CO2: 26 mmol/L (ref 22–32)
Chloride: 104 mmol/L (ref 101–111)
Creatinine, Ser: 0.77 mg/dL (ref 0.61–1.24)
Glucose, Bld: 182 mg/dL — ABNORMAL HIGH (ref 65–99)
POTASSIUM: 3.7 mmol/L (ref 3.5–5.1)
Sodium: 137 mmol/L (ref 135–145)

## 2015-11-10 LAB — GLUCOSE, CAPILLARY
GLUCOSE-CAPILLARY: 196 mg/dL — AB (ref 65–99)
GLUCOSE-CAPILLARY: 150 mg/dL — AB (ref 65–99)
Glucose-Capillary: 155 mg/dL — ABNORMAL HIGH (ref 65–99)

## 2015-11-10 MED ORDER — APIXABAN 5 MG PO TABS: 5.0000 mg | ORAL_TABLET | Freq: Two times a day (BID) | ORAL | Status: AC

## 2015-11-10 NOTE — Consult Note (Signed)
   [facility] CM Inpatient Consult   11/10/2015  Clifford [name] 08-07-1940 [id number]   Patient screened for potential [facility] services with [facility]. Patient is eligible for [place]. Electronic medical record reveals patient's discharge plan is for skilled nursing at [facility] facility.Came by to see the patient and he had already discharge to the facility.  [facility] services not appropriate at this time for community follow up.  For questions please contact:   Natividad Brood, RN BSN [facility] Liaison  [phone] business mobile phone Toll free office [phone]

## 2015-11-10 NOTE — Progress Notes (Signed)
Patient with increased confusion, states he is in [place]. Patient is joking that he knows where he is but is unable to name location. VSS, CBG 155, neuro assessment otherwise unchanged. Neurology aware.

## 2015-11-10 NOTE — Discharge Summary (Signed)
ELECTROPHYSIOLOGY PROCEDURE DISCHARGE SUMMARY    Patient ID: Clifford [name],  MRN: [id number], DOB/AGE: Jun 10, 1940 75 y.o.  Admit date: 11/05/2015 Discharge date: 11/10/2015  Primary Care Physician: Manon Hilding, MD Primary Cardiologist: Dr. Bronson Ing Electrophysiologist: Dr. Rayann Heman  Primary Discharge Diagnosis:  1. Permanent Afib     CHA2DS2Vasc is at least 6 on Eliquis 2. Acute CVA   Secondary Discharge Diagnosis:  1. Hx of GIB 2. Non-compliance with medications/anticoagulation 3. CAD 4. HTN 5. DM 6. Sick sinus syndrome with PPM  Allergies  Allergen Reactions  . Demerol Nausea And Vomiting     Procedures This Admission:  1. LEFT ATRIAL APPENDAGE OCCLUSION 11/05/15, Drs Burt Knack, Jamina Macbeth CONCLUSIONS:  1. UNSUCCESSFUL implantation of a WATCHMAN left atrial appendage occlusive device  2. No early apparent complications   2. TEE, 11/05/15 Dr. Meda Coffee Study Conclusions - Left ventricle: Systolic function was normal. The estimated  ejection fraction was in the range of 50% to 55%. Wall motion was  normal; there were no regional wall motion abnormalities. - Aortic valve: There was mild regurgitation. - Left atrium: The atrium was dilated. No evidence of thrombus in  the atrial cavity or appendage. No evidence of thrombus in the  atrial cavity or appendage. - Right atrium: No evidence of thrombus in the atrial cavity or  appendage. Impressions: - This was a periprocedural TEE during a Watchman device  implantation.   Brief HPI: Clifford [name] is a 75 y.o. male was referred to electrophysiology in the outpatient setting for consideration of LAA occlusion device.  Past medical history includes permanent Afib, SSSx with PPM, HTN, CAD, DM, RA, CVA, and GIB with known noncompliance with his anticoagulation. Risks, benefits, and alternatives to PPM implantation were reviewed with the patient who wished to proceed.  The patient was brought in and underwent the  Watchman procedure 11/05/15, post procedure noted with MS changes and diagnosed with acute CVA, admitted for further care and management     Hospital Course:  The patient was admitted and underwent the Watchman (LAA closure procedure) 11/05/15, though was unsuccessful and not deployed, please refer to procedure report for full details.  Overnight the evening of the procedure the patient was observed to have MS changes and developed aphasia, CT scan revealed no acute changes, neurology consultation was obtained, strong suspicion of seizure and started on Keppra vs stroke, R sided weakness observed as well and ultimatly felt to have had a recurrent CVA, his Keppra was eventually stopped.  The patient has shown significant neurological improvement though not to baseline.  He was evaluated by PT who recommended SNF/rehab, social services has been in communication with the patient's daughter in facility placement decision.  Yesterday, neurology signed off the case, recommended f/u in 2 months and Eliquis rx. Telemetry has been AFib with CVR, intermittent pacing, VSS, labs reviewed. The patient was examined by Dr. Rayann Heman and considered stable for discharge to SNF.  Out patient f/u with EP has been arranged.      Physical Exam: Filed Vitals:   11/09/15 2145 11/10/15 0210 11/10/15 0611 11/10/15 0756  BP: 144/80 102/41 126/77 114/80  Pulse: 83 72 84 87  Temp: 98.1 F (36.7 C) 98.3 F (36.8 C) 98.1 F (36.7 C) 97.8 F (36.6 C)  TempSrc: Oral Oral Oral Oral  Resp:  18 20 18   Height:      Weight:      SpO2: 99% 100% 93% 97%    GEN- The patient is appears in  NAD, alert and oriented x 3 today.   HEENT: normocephalic, atraumatic; sclera clear, conjunctiva pink; hearing intact; oropharynx clear; neck supple, no JVP Lungs- Clear to ausculation bilaterally, normal work of breathing.  No wheezes, rales, rhonchi Heart- irregular rate and rhythm, no significant murmurs, no rubs or gallops, PMI not laterally  displaced GI- soft, non-tender, non-distended Extremities- no clubbing, cyanosis, or edema MS- no significant deformity, age appropriate atrophy Skin- warm and dry, no rash or lesion, left chest without hematoma/ecchymosis Psych- euthymic mood, full affect Neuro- remains with some L sided weakness   Labs:   Lab Results  Component Value Date   WBC 4.5 11/10/2015   HGB 11.5* 11/10/2015   HCT 32.6* 11/10/2015   MCV 81.7 11/10/2015   PLT 214 11/10/2015     Recent Labs Lab 11/10/15 0401  NA 137  K 3.7  CL 104  CO2 26  BUN 7  CREATININE 0.77  CALCIUM 9.4  GLUCOSE 182*    Discharge Medications:    Medication List    STOP taking these medications        aspirin EC 81 MG tablet      TAKE these medications        apixaban 5 MG Tabs tablet  Commonly known as:  ELIQUIS  Take 1 tablet (5 mg total) by mouth 2 (two) times daily.     folic acid 1 MG tablet  Commonly known as:  FOLVITE  Take 1 mg by mouth daily.     glipiZIDE 10 MG tablet  Commonly known as:  GLUCOTROL  Take 10 mg by mouth daily.     hydrochlorothiazide 25 MG tablet  Commonly known as:  HYDRODIURIL  Take 25 mg by mouth daily.     levothyroxine 100 MCG tablet  Commonly known as:  SYNTHROID  Take 1 tablet (100 mcg total) by mouth daily before breakfast.     lisinopril 20 MG tablet  Commonly known as:  PRINIVIL,ZESTRIL  Take 20 mg by mouth daily.     lovastatin 40 MG tablet  Commonly known as:  MEVACOR  Take 60 mg by mouth at bedtime.     metFORMIN 500 MG tablet  Commonly known as:  GLUCOPHAGE  Take 1,000 mg by mouth 2 (two) times daily with a meal.     methotrexate 2.5 MG tablet  Commonly known as:  RHEUMATREX  Take 10 mg by mouth once a week. On Mondays- Caution:Chemotherapy. Protect from light.     nitroGLYCERIN 0.4 MG SL tablet  Commonly known as:  NITROSTAT  Place 1 tablet (0.4 mg total) under the tongue every 5 (five) minutes as needed for chest pain (MAX 3 TABLETS).      ranitidine 150 MG tablet  Commonly known as:  ZANTAC  Take 150 mg by mouth daily as needed for heartburn.     TOUJEO SOLOSTAR 300 UNIT/ML Sopn  Generic drug:  Insulin Glargine  Inject 40 Units into the skin daily.        Disposition: SNF/Rehab Discharge Instructions    Diet - low sodium heart healthy    Complete by:  As directed      Increase activity slowly    Complete by:  As directed           Follow-up Information    Follow up with Thompson Grayer, MD On 11/27/2015.   Specialty:  Cardiology   Why:  10:00AM   Contact information:   [facility] [place] [address]  [phone]       Follow up with Xu,Jindong, MD.   Specialty:  Neurology   Why:  Please call to arrange a 2 month follow up visit   Contact information:   [address] [id number] [phone]       Duration of Discharge Encounter: Greater than 30 minutes including physician time.  Signed, Tommye Standard, PA-C 11/10/2015 10:52 AM    I have seen, examined the patient, and reviewed the above assessment and plan.  Changes to above are made where necessary.  Stop asa.  Continue eliquis.  Follow-up with me in 2 weeks  Co Sign: Thompson Grayer, MD 11/10/2015 1:53 PM

## 2015-11-10 NOTE — Progress Notes (Signed)
Patient will discharge to [facility] Anticipated discharge date: 6/27 Family notified: pt dtr Transportation by [facility] called at 1:15pm  CSW signing off.  Domenica Reamer, [place] Social Worker [phone]

## 2015-11-10 NOTE — Evaluation (Signed)
Occupational Therapy Evaluation Patient Details Name: Clifford [name] MRN: [id number] DOB: 29-Nov-1940 Today's Date: 11/10/2015    History of Present Illness Clifford [name] is an 75 y.o. male with a history of stroke with residual aphasia, atrial fibrillation, hypertension, coronary artery disease, hyperlipidemia and diabetes mellitus, who underwent left atrial appendage occlusive procedure and later became agitated and confused and not following any commands. Speech output was somewhat nonsensical and repetitive. Code stroke was activated for possible acute worsening of expressive aphasia. No focal weakness was noted. CT scan of his head showed no acute intracranial abnormality. Old left frontal and parietal convexity infarctions with encephalomalacia . PMH includes A fib, HTN, DM, RA, CAD, goiter and CVA affecting L side   Clinical Impression   PT admitted with L MCA. Pt currently with functional limitiations due to the deficits listed below (see OT problem list). PTA was living at home alone. Pt has a daughter that lives in [place] but pt stated in session "she ain't going to do anything for me" Pt will benefit from skilled OT to increase their independence and safety with adls and balance to allow discharge SNF.     Follow Up Recommendations  SNF    Equipment Recommendations  Other (comment) (defer to SNF)    Recommendations for Other Services       Precautions / Restrictions Precautions Precautions: Fall Restrictions Weight Bearing Restrictions: No      Mobility Bed Mobility Overal bed mobility: Needs Assistance Bed Mobility: Supine to Sit;Sit to Supine     Supine to sit: Mod assist;HOB elevated     General bed mobility comments: needs (A) to sequence from R side. Pt pulling at the bed rail and RW. pt with very ataxic like movement to task.   Transfers Overall transfer level: Needs assistance Equipment used: Rolling walker (2 wheeled) Transfers: Sit to/from Stand Sit to  Stand: +2 physical assistance;Mod assist              Balance Overall balance assessment: Needs assistance Sitting-balance support: Bilateral upper extremity supported;Feet supported Sitting balance-Leahy Scale: Fair       Standing balance-Leahy Scale: Poor                              ADL Overall ADL's : Needs assistance/impaired     Grooming: Oral care;Moderate assistance;Sitting;Cueing for sequencing Grooming Details (indicate cue type and reason): needed (A) to sequence task, needed (A) to hold object in R hand, needed cues to sequence hand over hand bil UE use to complete oral care. Pt states I dont need to brush i dont have teeth. Pt without lower dentures but has upper teeth intact                 Toilet Transfer: +2 for physical assistance;Moderate assistance;RW Toilet Transfer Details (indicate cue type and reason): cues for sequence and cues for hand placement. L LE tactile input for knee extension. buckle noted          Functional mobility during ADLs: +2 for physical assistance;Moderate assistance;Rolling walker;Cueing for sequencing;Cueing for safety General ADL Comments: Pt lacks awareness to L side of body and needs multimodal cues for safety and activation. pt with L LE buckle with transfers     Vision Vision Assessment?: Vision impaired- to be further tested in functional context   Perception     Praxis      Pertinent Vitals/Pain Pain Assessment: No/denies pain  Hand Dominance Right   Extremity/Trunk Assessment Upper Extremity Assessment Upper Extremity Assessment: LUE deficits/detail;RUE deficits/detail RUE Deficits / Details: ataxic movement . unable to place finger to nose, hx of shoulder flexion deficits AROM currently to 110 degrees LUE Deficits / Details: numbness, decr strength, shoulder hx surg AROM shoulder flexion 80 degrees   Lower Extremity Assessment Lower Extremity Assessment: Defer to PT evaluation    Cervical / Trunk Assessment Cervical / Trunk Assessment: Kyphotic   Communication Communication Communication: Expressive difficulties   Cognition Arousal/Alertness: Awake/alert Behavior During Therapy: WFL for tasks assessed/performed Overall Cognitive Status: Impaired/Different from baseline Area of Impairment: Safety/judgement;Awareness       Following Commands: Follows multi-step commands inconsistently;Follows multi-step commands with increased time Safety/Judgement: Decreased awareness of safety;Decreased awareness of deficits Awareness: Emergent       General Comments       Exercises       Shoulder Instructions      Home Living Family/patient expects to be discharged to:: Skilled nursing facility Living Arrangements: Alone                                      Prior Functioning/Environment Level of Independence: Independent with assistive device(s)        Comments: Amb with cane    OT Diagnosis: Generalized weakness;Cognitive deficits;Disturbance of vision;Ataxia   OT Problem List: Decreased strength;Decreased activity tolerance;Impaired balance (sitting and/or standing);Impaired vision/perception;Decreased coordination;Decreased cognition;Decreased safety awareness;Decreased knowledge of use of DME or AE;Decreased knowledge of precautions;Impaired sensation;Impaired UE functional use   OT Treatment/Interventions: Self-care/ADL training;Therapeutic exercise;Neuromuscular education;DME and/or AE instruction;Therapeutic activities;Cognitive remediation/compensation;Visual/perceptual remediation/compensation;Patient/family education;Balance training    OT Goals(Current goals can be found in the care plan section) Acute Rehab OT Goals Patient Stated Goal: none stated OT Goal Formulation: With patient Time For Goal Achievement: 11/24/15 Potential to Achieve Goals: Good  OT Frequency: Min 2X/week   Barriers to D/C: Decreased caregiver support           Co-evaluation              End of Session Equipment Utilized During Treatment: Gait belt;Rolling walker Nurse Communication: Mobility status;Precautions  Activity Tolerance: Patient tolerated treatment well Patient left: in chair;with call bell/phone within reach;with chair alarm set   Time: [id number] OT Time Calculation (min): 23 min Charges:  OT General Charges $OT Visit: 1 Procedure OT Evaluation $OT Eval Moderate Complexity: 1 Procedure G-Codes:    Parke Poisson B 11-12-15, 12:39 PM   Jeri Modena   OTR/L [phone] Office: [phone] .

## 2015-11-10 NOTE — Care Management Note (Signed)
Case Management Note  Patient Details  Name: Clifford [name] MRN: [id number] Date of Birth: [birth year]  Subjective/Objective:                    Action/Plan: Pt discharging to [facility]. No further needs per CM.   Expected Discharge Date:                  Expected Discharge Plan:     In-House Referral:     Discharge planning Services     Post Acute Care Choice:    Choice offered to:     DME Arranged:    DME Agency:     HH Arranged:    HH Agency:     Status of Service:  In process, will continue to follow  If discussed at Long Length of Stay Meetings, dates discussed:    Additional Comments:  Pollie Friar, RN 11/10/2015, 2:05 PM

## 2015-11-10 NOTE — Clinical Social Work Placement (Signed)
   CLINICAL SOCIAL WORK PLACEMENT  NOTE  Date:  11/10/2015  Patient Details  Name: Clifford [name] MRN: [id number] Date of Birth: 1940-08-15  Clinical Social Work is seeking post-discharge placement for this patient at the [place] level of care (*CSW will initial, date and re-position this form in  chart as items are completed):  Yes   Patient/family provided with [place] Work Department's list of facilities offering this level of care within the geographic area requested by the patient (or if unable, by the patient's family).  Yes   Patient/family informed of their freedom to choose among providers that offer the needed level of care, that participate in Medicare, Medicaid or managed care program needed by the patient, have an available bed and are willing to accept the patient.  Yes   Patient/family informed of [facility]'s ownership interest in [facility] and [facility], as well as of the fact that they are under no obligation to receive care at these facilities.  PASRR submitted to EDS on 11/09/15     PASRR number received on 11/09/15     Existing PASRR number confirmed on       FL2 transmitted to all facilities in geographic area requested by pt/family on 11/09/15     FL2 transmitted to all facilities within larger geographic area on       Patient informed that his/her managed care company has contracts with or will negotiate with certain facilities, including the following:        Yes   Patient/family informed of bed offers received.  Patient chooses bed at [facility]     Physician recommends and patient chooses bed at      Patient to be transferred to [facility] on 11/10/15.  Patient to be transferred to facility by ptar     Patient family notified on 11/10/15 of transfer.  Name of family member notified:  Rod Holler     PHYSICIAN Please sign FL2     Additional Comment:     _______________________________________________ Cranford Mon, LCSW 11/10/2015, 1:19 PM

## 2015-11-27 ENCOUNTER — Encounter: Payer: Commercial Managed Care - HMO | Admitting: Internal Medicine

## 2015-12-11 ENCOUNTER — Telehealth: Payer: Self-pay | Admitting: Internal Medicine

## 2015-12-11 NOTE — Telephone Encounter (Signed)
New message   Pt daughter is calling to speak to rn and amber she wants to inform [facility] of his passing and she wants to say think you for the care that was given  She is asking that [employer] calls her personally

## 2015-12-11 NOTE — Telephone Encounter (Signed)
Will send message to Dr. Rayann Heman and [name] PA.

## 2015-12-14 NOTE — Telephone Encounter (Signed)
Left message for daughter. Will try to call back later this week  [name], NP 12/14/2015 3:21 PM

## 2015-12-15 DEATH — deceased

## 2015-12-18 NOTE — Telephone Encounter (Signed)
Left message for daughter.  Will try again next week  [name], NP 12/18/2015 3:16 PM

## 2016-01-05 ENCOUNTER — Ambulatory Visit: Payer: Self-pay | Admitting: Neurology

## 2016-01-13 NOTE — Telephone Encounter (Signed)
Left message for daughter. I have left her my cell phone number to call as I have been unable to reach her several times.   [name], NP 01/13/2016 2:37 PM

## 2016-08-19 ENCOUNTER — Encounter: Payer: Commercial Managed Care - HMO | Admitting: Internal Medicine

## 2017-05-03 IMAGING — CT CT ANGIO NECK
2 of 10 series · 6 of 33 positions shown · IV contrast (Iohexol (Omnipaque 350))
Comparison: CT head 11/06/2015

CLINICAL DATA: Code stroke. Right-sided weakness. Cardiac
catheterization 11/05/2015

EXAM:
CT ANGIOGRAPHY HEAD AND NECK
TECHNIQUE: Multidetector CT imaging of the head and neck was performed using
the standard protocol during bolus administration of intravenous
contrast. Multiplanar CT image reconstructions and MIPs were
obtained to evaluate the vascular anatomy. Carotid stenosis
measurements (when applicable) are obtained utilizing NASCET
criteria, using the distal internal carotid diameter as the
denominator.
CONTRAST:  40 mL Isovue 370 for CT perfusion. 60 mL Isovue 370 for
CT angio

[Series 201: perfusion · axial · 0.46mm/px · z∈[+395,+440]mm · 4 of 640 slices shown]
[im 128/640  soft-tissue]
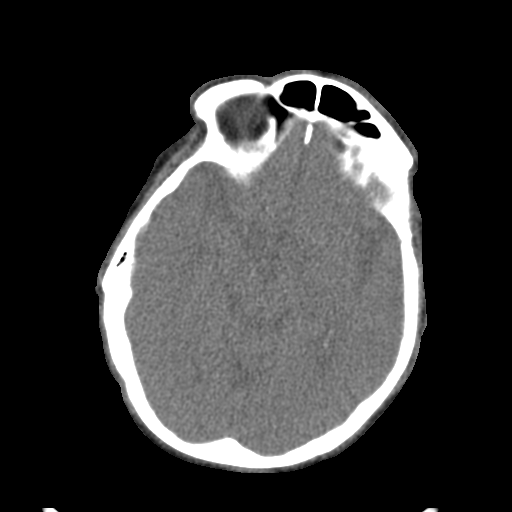
[im 256/640  soft-tissue]
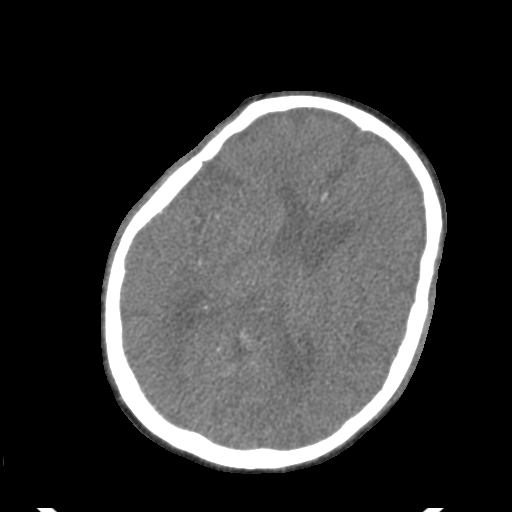
[im 384/640  soft-tissue]
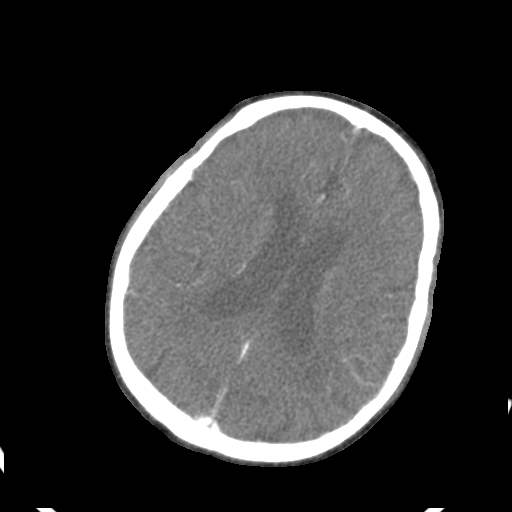
[im 512/640  soft-tissue]
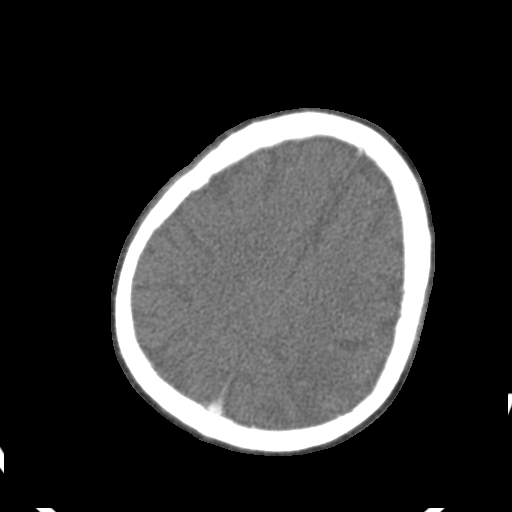

[Series 503: axial 1 x 1 · axial · 0.55mm/px · z∈[+255,+374]mm · 2 of 340 slices shown]
[im 114/340  soft-tissue]
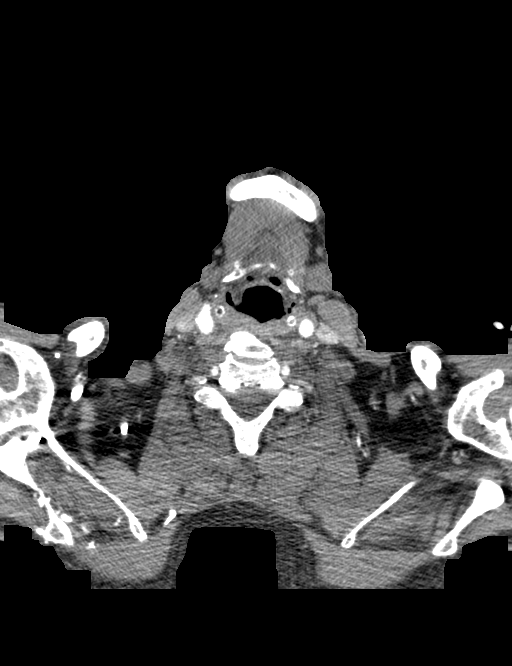
[im 227/340  bone]
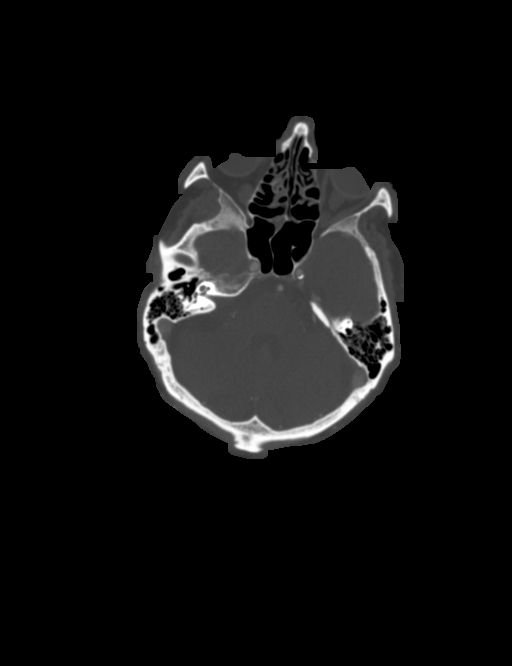

[6 of 33 positions shown; findings below may reference images not displayed]

FINDINGS: CTA NECK

Aortic arch: Mild atherosclerotic calcification aortic arch and
proximal great vessels. Proximal great vessels widely patent.
Pacemaker leads noted.

Moderate to advanced apical emphysema.

Right carotid system: Right common carotid artery widely patent.
Mild atherosclerotic calcification right carotid bifurcation without
significant carotid stenosis.

Left carotid system: Left common carotid artery widely patent. Mild
atherosclerotic calcification at the carotid bifurcation without
significant carotid stenosis.

Vertebral arteries:Left vertebral dominant. Left vertebral dominant.
Left vertebral artery widely patent to the basilar without stenosis.
Right vertebral artery mild stenosis at the level of C1. Occlusion
of the distal right vertebral artery past PICA due to calcific
plaque.

Skeleton: Cervical spondylosis.  No fracture or bony lesion.

Other neck: Thyroidectomy clips.  No mass or adenopathy in the neck.

CTA HEAD

Anterior circulation: Extensive atherosclerotic calcification in the
cavernous carotid bilaterally causing mild to moderate stenosis
bilaterally. Severe stenosis of the supraclinoid internal carotid
artery bilaterally. Anterior and middle cerebral arteries patent
bilaterally without significant stenosis.

Posterior circulation: Left vertebral artery widely patent to the
basilar. Small right vertebral artery is occluded distally due to
atherosclerotic plaque. PICA patent bilaterally. Basilar patent.
Superior cerebellar and posterior cerebral arteries patent
bilaterally. Severe focal stenosis in the right posterior cerebral
artery proximally. Left posterior cerebral artery widely patent.

Venous sinuses: Patent

Anatomic variants: Negative for cerebral aneurysm.

Delayed phase: Not performed
IMPRESSION: CT perfusion degraded by patient motion causing artifact. No
evidence of acute ischemia in the left cerebral hemisphere. Probable
artifact right cerebral hemisphere on CT perfusion

Mild atherosclerotic disease at the carotid bifurcation bilaterally
without significant carotid stenosis in the neck

Diffuse atherosclerotic disease throughout the cavernous carotid
bilaterally. Severe stenosis of the supraclinoid internal carotid
artery bilaterally.

Severe stenosis proximal right posterior cerebral artery.

Right vertebral artery occluded distal to PICA

The findings were discussed with Dr. Betsuki on 11/06/2015 at
2588 hours.
